# Patient Record
Sex: Female | Born: 1935 | Race: White | Hispanic: No | Smoking: Former smoker
Health system: Southern US, Community
[De-identification: ages and names within clinical notes are randomized; demographics above are authoritative.]

## PROBLEM LIST (undated history)

## (undated) DIAGNOSIS — E785 Hyperlipidemia, unspecified: Secondary | ICD-10-CM

## (undated) DIAGNOSIS — R29898 Other symptoms and signs involving the musculoskeletal system: Secondary | ICD-10-CM

## (undated) DIAGNOSIS — M48061 Spinal stenosis, lumbar region without neurogenic claudication: Secondary | ICD-10-CM

## (undated) DIAGNOSIS — G629 Polyneuropathy, unspecified: Secondary | ICD-10-CM

## (undated) DIAGNOSIS — G1221 Amyotrophic lateral sclerosis: Secondary | ICD-10-CM

## (undated) DIAGNOSIS — I10 Essential (primary) hypertension: Secondary | ICD-10-CM

## (undated) DIAGNOSIS — I350 Nonrheumatic aortic (valve) stenosis: Secondary | ICD-10-CM

## (undated) DIAGNOSIS — I2510 Atherosclerotic heart disease of native coronary artery without angina pectoris: Secondary | ICD-10-CM

## (undated) HISTORY — PX: HAND SURGERY: SHX662

## (undated) HISTORY — DX: Essential (primary) hypertension: I10

## (undated) HISTORY — DX: Spinal stenosis, lumbar region without neurogenic claudication: M48.061

## (undated) HISTORY — DX: Polyneuropathy, unspecified: G62.9

## (undated) HISTORY — PX: CHOLECYSTECTOMY: SHX55

## (undated) HISTORY — DX: Hyperlipidemia, unspecified: E78.5

## (undated) HISTORY — DX: Atherosclerotic heart disease of native coronary artery without angina pectoris: I25.10

## (undated) HISTORY — DX: Other symptoms and signs involving the musculoskeletal system: R29.898

## (undated) HISTORY — PX: CARDIAC SURGERY: SHX584

## (undated) HISTORY — DX: Nonrheumatic aortic (valve) stenosis: I35.0

## (undated) HISTORY — PX: CATARACT EXTRACTION: SUR2

---

## 2011-10-08 DIAGNOSIS — M790 Rheumatism, unspecified: Secondary | ICD-10-CM | POA: Diagnosis not present

## 2011-10-25 DIAGNOSIS — I359 Nonrheumatic aortic valve disorder, unspecified: Secondary | ICD-10-CM | POA: Diagnosis not present

## 2011-10-26 DIAGNOSIS — E785 Hyperlipidemia, unspecified: Secondary | ICD-10-CM | POA: Diagnosis not present

## 2011-11-10 DIAGNOSIS — I10 Essential (primary) hypertension: Secondary | ICD-10-CM | POA: Diagnosis not present

## 2011-11-10 DIAGNOSIS — E785 Hyperlipidemia, unspecified: Secondary | ICD-10-CM | POA: Diagnosis not present

## 2011-11-10 DIAGNOSIS — I209 Angina pectoris, unspecified: Secondary | ICD-10-CM | POA: Diagnosis not present

## 2011-11-10 DIAGNOSIS — I2510 Atherosclerotic heart disease of native coronary artery without angina pectoris: Secondary | ICD-10-CM | POA: Diagnosis not present

## 2011-12-03 DIAGNOSIS — R209 Unspecified disturbances of skin sensation: Secondary | ICD-10-CM | POA: Diagnosis not present

## 2011-12-03 DIAGNOSIS — IMO0002 Reserved for concepts with insufficient information to code with codable children: Secondary | ICD-10-CM | POA: Diagnosis not present

## 2011-12-03 DIAGNOSIS — M79609 Pain in unspecified limb: Secondary | ICD-10-CM | POA: Diagnosis not present

## 2011-12-03 DIAGNOSIS — G5600 Carpal tunnel syndrome, unspecified upper limb: Secondary | ICD-10-CM | POA: Diagnosis not present

## 2011-12-14 DIAGNOSIS — M5412 Radiculopathy, cervical region: Secondary | ICD-10-CM | POA: Diagnosis not present

## 2011-12-14 DIAGNOSIS — M5020 Other cervical disc displacement, unspecified cervical region: Secondary | ICD-10-CM | POA: Diagnosis not present

## 2012-05-09 DIAGNOSIS — E785 Hyperlipidemia, unspecified: Secondary | ICD-10-CM | POA: Diagnosis not present

## 2012-05-10 DIAGNOSIS — I209 Angina pectoris, unspecified: Secondary | ICD-10-CM | POA: Diagnosis not present

## 2012-05-10 DIAGNOSIS — I359 Nonrheumatic aortic valve disorder, unspecified: Secondary | ICD-10-CM | POA: Diagnosis not present

## 2012-05-10 DIAGNOSIS — I2510 Atherosclerotic heart disease of native coronary artery without angina pectoris: Secondary | ICD-10-CM | POA: Diagnosis not present

## 2012-05-10 DIAGNOSIS — I10 Essential (primary) hypertension: Secondary | ICD-10-CM | POA: Diagnosis not present

## 2012-06-01 DIAGNOSIS — K2990 Gastroduodenitis, unspecified, without bleeding: Secondary | ICD-10-CM | POA: Diagnosis not present

## 2012-06-01 DIAGNOSIS — K2970 Gastritis, unspecified, without bleeding: Secondary | ICD-10-CM | POA: Diagnosis not present

## 2012-06-01 DIAGNOSIS — Z23 Encounter for immunization: Secondary | ICD-10-CM | POA: Diagnosis not present

## 2012-11-06 DIAGNOSIS — Z79899 Other long term (current) drug therapy: Secondary | ICD-10-CM | POA: Diagnosis not present

## 2012-11-24 DIAGNOSIS — I359 Nonrheumatic aortic valve disorder, unspecified: Secondary | ICD-10-CM | POA: Diagnosis not present

## 2012-11-24 DIAGNOSIS — I10 Essential (primary) hypertension: Secondary | ICD-10-CM | POA: Diagnosis not present

## 2012-11-24 DIAGNOSIS — I2510 Atherosclerotic heart disease of native coronary artery without angina pectoris: Secondary | ICD-10-CM | POA: Diagnosis not present

## 2012-11-24 DIAGNOSIS — I209 Angina pectoris, unspecified: Secondary | ICD-10-CM | POA: Diagnosis not present

## 2013-02-14 DIAGNOSIS — M4802 Spinal stenosis, cervical region: Secondary | ICD-10-CM | POA: Diagnosis not present

## 2013-02-25 DIAGNOSIS — M545 Low back pain: Secondary | ICD-10-CM | POA: Diagnosis not present

## 2013-02-25 DIAGNOSIS — M47814 Spondylosis without myelopathy or radiculopathy, thoracic region: Secondary | ICD-10-CM | POA: Diagnosis not present

## 2013-02-25 DIAGNOSIS — R262 Difficulty in walking, not elsewhere classified: Secondary | ICD-10-CM | POA: Diagnosis present

## 2013-02-25 DIAGNOSIS — M47812 Spondylosis without myelopathy or radiculopathy, cervical region: Secondary | ICD-10-CM | POA: Diagnosis not present

## 2013-02-25 DIAGNOSIS — M549 Dorsalgia, unspecified: Secondary | ICD-10-CM | POA: Diagnosis not present

## 2013-02-25 DIAGNOSIS — D649 Anemia, unspecified: Secondary | ICD-10-CM | POA: Diagnosis not present

## 2013-02-25 DIAGNOSIS — M47817 Spondylosis without myelopathy or radiculopathy, lumbosacral region: Secondary | ICD-10-CM | POA: Diagnosis not present

## 2013-02-25 DIAGNOSIS — I10 Essential (primary) hypertension: Secondary | ICD-10-CM | POA: Diagnosis not present

## 2013-02-25 DIAGNOSIS — Z7982 Long term (current) use of aspirin: Secondary | ICD-10-CM | POA: Diagnosis not present

## 2013-02-25 DIAGNOSIS — Z602 Problems related to living alone: Secondary | ICD-10-CM | POA: Diagnosis not present

## 2013-02-25 DIAGNOSIS — E785 Hyperlipidemia, unspecified: Secondary | ICD-10-CM | POA: Diagnosis not present

## 2013-02-25 DIAGNOSIS — M5126 Other intervertebral disc displacement, lumbar region: Secondary | ICD-10-CM | POA: Diagnosis not present

## 2013-03-08 DIAGNOSIS — M479 Spondylosis, unspecified: Secondary | ICD-10-CM | POA: Diagnosis not present

## 2013-05-07 DIAGNOSIS — G5600 Carpal tunnel syndrome, unspecified upper limb: Secondary | ICD-10-CM | POA: Diagnosis not present

## 2013-05-14 DIAGNOSIS — I10 Essential (primary) hypertension: Secondary | ICD-10-CM | POA: Diagnosis not present

## 2013-05-14 DIAGNOSIS — E789 Disorder of lipoprotein metabolism, unspecified: Secondary | ICD-10-CM | POA: Diagnosis not present

## 2013-05-14 DIAGNOSIS — G5600 Carpal tunnel syndrome, unspecified upper limb: Secondary | ICD-10-CM | POA: Diagnosis not present

## 2013-05-14 DIAGNOSIS — I2510 Atherosclerotic heart disease of native coronary artery without angina pectoris: Secondary | ICD-10-CM | POA: Diagnosis not present

## 2013-05-17 DIAGNOSIS — I359 Nonrheumatic aortic valve disorder, unspecified: Secondary | ICD-10-CM | POA: Diagnosis not present

## 2013-05-17 DIAGNOSIS — R011 Cardiac murmur, unspecified: Secondary | ICD-10-CM | POA: Diagnosis not present

## 2013-05-21 DIAGNOSIS — Z951 Presence of aortocoronary bypass graft: Secondary | ICD-10-CM | POA: Diagnosis not present

## 2013-05-21 DIAGNOSIS — I10 Essential (primary) hypertension: Secondary | ICD-10-CM | POA: Diagnosis not present

## 2013-05-21 DIAGNOSIS — K219 Gastro-esophageal reflux disease without esophagitis: Secondary | ICD-10-CM | POA: Diagnosis not present

## 2013-05-21 DIAGNOSIS — G5600 Carpal tunnel syndrome, unspecified upper limb: Secondary | ICD-10-CM | POA: Diagnosis not present

## 2013-05-21 DIAGNOSIS — R29898 Other symptoms and signs involving the musculoskeletal system: Secondary | ICD-10-CM | POA: Diagnosis not present

## 2013-05-21 DIAGNOSIS — R209 Unspecified disturbances of skin sensation: Secondary | ICD-10-CM | POA: Diagnosis not present

## 2013-05-21 DIAGNOSIS — Z01818 Encounter for other preprocedural examination: Secondary | ICD-10-CM | POA: Diagnosis not present

## 2013-05-24 DIAGNOSIS — G5600 Carpal tunnel syndrome, unspecified upper limb: Secondary | ICD-10-CM | POA: Diagnosis not present

## 2013-05-31 DIAGNOSIS — I2510 Atherosclerotic heart disease of native coronary artery without angina pectoris: Secondary | ICD-10-CM | POA: Diagnosis not present

## 2013-05-31 DIAGNOSIS — E785 Hyperlipidemia, unspecified: Secondary | ICD-10-CM | POA: Diagnosis not present

## 2013-05-31 DIAGNOSIS — I209 Angina pectoris, unspecified: Secondary | ICD-10-CM | POA: Diagnosis not present

## 2013-05-31 DIAGNOSIS — I359 Nonrheumatic aortic valve disorder, unspecified: Secondary | ICD-10-CM | POA: Diagnosis not present

## 2013-06-22 DIAGNOSIS — Z4889 Encounter for other specified surgical aftercare: Secondary | ICD-10-CM | POA: Diagnosis not present

## 2013-06-29 DIAGNOSIS — Z4889 Encounter for other specified surgical aftercare: Secondary | ICD-10-CM | POA: Diagnosis not present

## 2013-07-03 DIAGNOSIS — Z4889 Encounter for other specified surgical aftercare: Secondary | ICD-10-CM | POA: Diagnosis not present

## 2013-07-06 DIAGNOSIS — Z4889 Encounter for other specified surgical aftercare: Secondary | ICD-10-CM | POA: Diagnosis not present

## 2013-07-12 DIAGNOSIS — Z4889 Encounter for other specified surgical aftercare: Secondary | ICD-10-CM | POA: Diagnosis not present

## 2013-07-17 DIAGNOSIS — Z4889 Encounter for other specified surgical aftercare: Secondary | ICD-10-CM | POA: Diagnosis not present

## 2013-08-02 DIAGNOSIS — Z4889 Encounter for other specified surgical aftercare: Secondary | ICD-10-CM | POA: Diagnosis not present

## 2013-08-10 DIAGNOSIS — Z4889 Encounter for other specified surgical aftercare: Secondary | ICD-10-CM | POA: Diagnosis not present

## 2013-08-14 DIAGNOSIS — Z4889 Encounter for other specified surgical aftercare: Secondary | ICD-10-CM | POA: Diagnosis not present

## 2013-11-16 DIAGNOSIS — S0280XA Fracture of other specified skull and facial bones, unspecified side, initial encounter for closed fracture: Secondary | ICD-10-CM | POA: Diagnosis not present

## 2013-11-16 DIAGNOSIS — M62838 Other muscle spasm: Secondary | ICD-10-CM | POA: Diagnosis not present

## 2013-12-11 DIAGNOSIS — I359 Nonrheumatic aortic valve disorder, unspecified: Secondary | ICD-10-CM | POA: Diagnosis not present

## 2013-12-25 DIAGNOSIS — I209 Angina pectoris, unspecified: Secondary | ICD-10-CM | POA: Diagnosis not present

## 2013-12-25 DIAGNOSIS — E785 Hyperlipidemia, unspecified: Secondary | ICD-10-CM | POA: Diagnosis not present

## 2013-12-25 DIAGNOSIS — I10 Essential (primary) hypertension: Secondary | ICD-10-CM | POA: Diagnosis not present

## 2013-12-25 DIAGNOSIS — I359 Nonrheumatic aortic valve disorder, unspecified: Secondary | ICD-10-CM | POA: Diagnosis not present

## 2013-12-27 DIAGNOSIS — M542 Cervicalgia: Secondary | ICD-10-CM | POA: Diagnosis not present

## 2014-01-01 DIAGNOSIS — M542 Cervicalgia: Secondary | ICD-10-CM | POA: Diagnosis not present

## 2014-01-03 DIAGNOSIS — M542 Cervicalgia: Secondary | ICD-10-CM | POA: Diagnosis not present

## 2014-01-07 DIAGNOSIS — M542 Cervicalgia: Secondary | ICD-10-CM | POA: Diagnosis not present

## 2014-01-10 DIAGNOSIS — M542 Cervicalgia: Secondary | ICD-10-CM | POA: Diagnosis not present

## 2014-01-15 DIAGNOSIS — M542 Cervicalgia: Secondary | ICD-10-CM | POA: Diagnosis not present

## 2014-01-17 DIAGNOSIS — M542 Cervicalgia: Secondary | ICD-10-CM | POA: Diagnosis not present

## 2014-02-27 DIAGNOSIS — E785 Hyperlipidemia, unspecified: Secondary | ICD-10-CM | POA: Diagnosis not present

## 2014-09-30 DIAGNOSIS — I2510 Atherosclerotic heart disease of native coronary artery without angina pectoris: Secondary | ICD-10-CM | POA: Diagnosis not present

## 2014-09-30 DIAGNOSIS — Z79899 Other long term (current) drug therapy: Secondary | ICD-10-CM | POA: Diagnosis not present

## 2014-09-30 DIAGNOSIS — E785 Hyperlipidemia, unspecified: Secondary | ICD-10-CM | POA: Diagnosis not present

## 2014-09-30 DIAGNOSIS — I10 Essential (primary) hypertension: Secondary | ICD-10-CM | POA: Diagnosis not present

## 2014-09-30 DIAGNOSIS — K219 Gastro-esophageal reflux disease without esophagitis: Secondary | ICD-10-CM | POA: Diagnosis not present

## 2014-09-30 DIAGNOSIS — G5600 Carpal tunnel syndrome, unspecified upper limb: Secondary | ICD-10-CM | POA: Diagnosis not present

## 2014-09-30 DIAGNOSIS — M5137 Other intervertebral disc degeneration, lumbosacral region: Secondary | ICD-10-CM | POA: Diagnosis not present

## 2014-09-30 DIAGNOSIS — G629 Polyneuropathy, unspecified: Secondary | ICD-10-CM | POA: Diagnosis not present

## 2014-09-30 DIAGNOSIS — Z23 Encounter for immunization: Secondary | ICD-10-CM | POA: Diagnosis not present

## 2014-09-30 DIAGNOSIS — Z7901 Long term (current) use of anticoagulants: Secondary | ICD-10-CM | POA: Diagnosis not present

## 2014-10-31 DIAGNOSIS — M6281 Muscle weakness (generalized): Secondary | ICD-10-CM | POA: Diagnosis not present

## 2014-11-04 DIAGNOSIS — R0989 Other specified symptoms and signs involving the circulatory and respiratory systems: Secondary | ICD-10-CM | POA: Diagnosis not present

## 2014-11-04 DIAGNOSIS — E782 Mixed hyperlipidemia: Secondary | ICD-10-CM | POA: Diagnosis not present

## 2014-11-04 DIAGNOSIS — I10 Essential (primary) hypertension: Secondary | ICD-10-CM | POA: Diagnosis not present

## 2014-11-04 DIAGNOSIS — I2510 Atherosclerotic heart disease of native coronary artery without angina pectoris: Secondary | ICD-10-CM | POA: Diagnosis not present

## 2014-11-11 ENCOUNTER — Encounter: Payer: Self-pay | Admitting: *Deleted

## 2014-11-12 ENCOUNTER — Encounter: Payer: Self-pay | Admitting: Neurology

## 2014-11-12 ENCOUNTER — Ambulatory Visit (INDEPENDENT_AMBULATORY_CARE_PROVIDER_SITE_OTHER): Payer: Medicare Other | Admitting: Neurology

## 2014-11-12 VITALS — BP 122/68 | HR 64 | Ht 63.0 in | Wt 160.0 lb

## 2014-11-12 DIAGNOSIS — M6281 Muscle weakness (generalized): Secondary | ICD-10-CM | POA: Diagnosis not present

## 2014-11-12 DIAGNOSIS — M6259 Muscle wasting and atrophy, not elsewhere classified, multiple sites: Secondary | ICD-10-CM | POA: Diagnosis not present

## 2014-11-12 DIAGNOSIS — M6258 Muscle wasting and atrophy, not elsewhere classified, other site: Secondary | ICD-10-CM

## 2014-11-12 NOTE — Progress Notes (Signed)
PATIENT: Barbara Collins DOB: [birth year]  HISTORICAL  Barbara Collins is a 81 yo RH female with her daughter Barbara Collins at visit, referred by primary care physician Dr. Jillyn Hidden.  She had a history of hypertension, hyperlipidemia, coronary artery disease, open heart surgery  In 2013, she had accident of neck being hit by her trunk when she bending down, she had instant neck pain, a month later, she noticed right hand muscle weakness, no sensory loss, no significant neck pain at that time, no left arm involvement, no gait difficulty, she was diagnosed with right hand carpal tunnel, had carpal tunnel release surgery around 2014, this was done at [place], without improving her symptoms,  She had a gradually worsening right hand, arm muscle atrophy, and weakness, significant worsening even since she moved in with her daughter in September 2015.  She has mild bilateral toes paresthesia, no gait difficulty, no bowel and bladder incontinence, no left hand symptoms.  REVIEW OF SYSTEMS: Full 14 system review of systems performed and notable only for easy bruising  ALLERGIES: Not on File  HOME MEDICATIONS: Current Outpatient Prescriptions  Medication Sig Dispense Refill  . Ascorbic Acid (VITAMIN C PO) Take by mouth.    Marland Kitchen aspirin EC 81 MG tablet Take 81 mg by mouth daily.    Marland Kitchen atorvastatin (LIPITOR) 20 MG tablet Take 20 mg by mouth daily.    Marland Kitchen ezetimibe (ZETIA) 10 MG tablet Take 10 mg by mouth every other day.    . famotidine (PEPCID AC) 10 MG chewable tablet Chew 10 mg by mouth as needed for heartburn.    . Fish Oil-Cholecalciferol (FISH OIL + D3 PO) Take by mouth.    . gabapentin (NEURONTIN) 100 MG capsule Take 100 mg by mouth. Two capsules twice daily.    . metoprolol (LOPRESSOR) 50 MG tablet Take 50 mg by mouth. 0.5 tablet twice daily    . potassium chloride (K-DUR,KLOR-CON) 10 MEQ tablet Take 10 mEq by mouth 2 (two) times daily.    . valsartan-hydrochlorothiazide (DIOVAN-HCT) 80-12.5 MG per tablet Take  1 tablet by mouth daily.     No current facility-administered medications for this visit.    PAST MEDICAL HISTORY: Past Medical History  Diagnosis Date  . Hypertension   . Hyperlipemia   . Neuropathy     Feet  . Right arm weakness   . Aortic stenosis   . Lumbar spinal stenosis   . CAD (coronary artery disease)     PAST SURGICAL HISTORY: Past Surgical History  Procedure Laterality Date  . Cardiac surgery      2005  . Cesarean section    . Cholecystectomy    . Cataract extraction      Bilateral  . Hand surgery      Right    FAMILY HISTORY: Family History  Problem Relation Age of Onset  . Hypertension Mother     SOCIAL HISTORY:  History   Social History  . Marital Status: Widowed    Spouse Name: N/A  . Number of Children: 1  . Years of Education: College   Occupational History  . Not on file.   Social History Main Topics  . Smoking status: Former Games developer  . Smokeless tobacco: Not on file  . Alcohol Use: No     Comment: Rare  . Drug Use: No  . Sexual Activity: Not on file   Other Topics Concern  . Not on file   Social History Narrative   Lives at home with her  daughter.   Right-handed.   2 cups caffeine/day       PHYSICAL EXAM   Filed Vitals:   11/12/14 1050  BP: 122/68  Pulse: 64  Height: 5\' 3"  (1.6 m)  Weight: 160 lb (72.576 kg)    Not recorded      Body mass index is 28.35 kg/(m^2).  PHYSICAL EXAMNIATION:  Gen: NAD, conversant, well nourised, obese, well groomed                     Cardiovascular: Regular rate rhythm, no peripheral edema, warm, nontender. Eyes: Conjunctivae clear without exudates or hemorrhage Neck: Supple, no carotid bruise. Pulmonary: Clear to auscultation bilaterally   NEUROLOGICAL EXAM:  MENTAL STATUS: Speech:    Speech is normal; fluent and spontaneous with normal comprehension.  Cognition:    The patient is oriented to person, place, and time;     recent and remote memory intact;     language  fluent;     normal attention, concentration,     fund of knowledge.  CRANIAL NERVES: CN II: Visual fields are full to confrontation. Fundoscopic exam is normal with sharp discs and no vascular changes. Venous pulsations are present bilaterally. Pupils are 4 mm and briskly reactive to light. Visual acuity is 20/20 bilaterally. CN III, IV, VI: extraocular movement are normal. No ptosis. CN V: Facial sensation is intact to pinprick in all 3 divisions bilaterally. Corneal responses are intact.  CN VII: Face is symmetric with normal eye closure and smile. Mild cheek puff weakness CN VIII: Hearing is normal to rubbing fingers CN IX, X: Palate elevates symmetrically. Phonation is normal. CN XI: Head turning and shoulder shrug are intact CN XII: Tongue is midline with normal movements and no atrophy. Mild neck flexion weakness.  MOTOR: Right hand significant atrophy   Shoulder abduction Shoulder external rotation Elbow flexion Elbow extension Wrist flexion Wrist extension Finger abduction Hip flexion Knee flexion Knee extension Ankle dorsi flexion Ankle plantar flexion  R 4 4 4- 4 3 3 3 5 5 5 5 5   L 5 5 5 5 5 5 5 5 5 5 5 5     REFLEXES: Reflexes are 2+ and symmetric at the biceps, triceps, knees, and ankles. Plantar responses are flexor.  SENSORY: Light touch, pinprick, position sense, and vibration sense are intact in fingers and toes.  COORDINATION: Rapid alternating movements and fine finger movements are intact. There is no dysmetria on finger-to-nose and heel-knee-shin. There are no abnormal or extraneous movements.   GAIT/STANCE: Posture is normal. Gait is steady with normal steps, base, arm swing, and turning. Heel and toe walking are normal. Tandem gait is normal.  Romberg is absent.   DIAGNOSTIC DATA (LABS, IMAGING, TESTING) - I reviewed patient records, labs, notes, testing and imaging myself where available.  ASSESSMENT AND PLAN  Barbara Collins is a 81 y.o. female  presenting  with painless right hand, arm weakness since 2013, this following her neck injury, on examination, she has significant atrophy of right hand muscles, mild right arm muscle atrophy, hyperreflexia, mild neck flexion weakness, facial muscle weakness.   1. Differentiation diagnosis includes cervical pathology, central nervous system degenerative disorder 2, EMG nerve conduction study 3. MRI of cervical spine   Levert FeinsteinYijun Aleynah Rocchio, M.D. Ph.D.  [facility] [address], [address] [place], [place] [address] Ph: [phone]) [phone] Fax: [phone]

## 2014-11-20 ENCOUNTER — Ambulatory Visit (INDEPENDENT_AMBULATORY_CARE_PROVIDER_SITE_OTHER): Payer: Medicare Other | Admitting: Neurology

## 2014-11-20 DIAGNOSIS — M6258 Muscle wasting and atrophy, not elsewhere classified, other site: Secondary | ICD-10-CM | POA: Insufficient documentation

## 2014-11-20 DIAGNOSIS — M6259 Muscle wasting and atrophy, not elsewhere classified, multiple sites: Secondary | ICD-10-CM | POA: Diagnosis not present

## 2014-11-20 DIAGNOSIS — M6281 Muscle weakness (generalized): Secondary | ICD-10-CM

## 2014-11-20 DIAGNOSIS — R531 Weakness: Secondary | ICD-10-CM | POA: Diagnosis not present

## 2014-11-20 NOTE — Procedures (Signed)
   NCS (NERVE CONDUCTION STUDY) WITH EMG (ELECTROMYOGRAPHY) REPORT   STUDY DATE: November 20 2014 PATIENT NAME: Barbara Collins DOB: 07-01-1933 MRN: [id number]    TECHNOLOGIST: Gearldine ShownLorraine Jones ELECTROMYOGRAPHER: Levert FeinsteinYan, Shuaib Corsino M.D.  CLINICAL INFORMATION:  81 years old female, presenting with painless muscle atrophy, weakness  On examination: She has mild eye-closure, cheek puff weakness, mild neck flexion weakness, significant right arm, hand muscle atrophy, moderate right shoulder abduction, flexion, extension weakness, significant right hand muscle weakness, mild left upper extremity proximal, bilateral lower extremity proximal muscle weakness. hyperreflexia.  FINDINGS: NERVE CONDUCTION STUDY: Bilateral median, ulnar sensory responses were normal. Left ulnar, median motor responses were normal, there was no evidence of conduction block. Right median, ulnar motor responses showed significantly decreased to C map amplitude, with normal conduction velocity.  NEEDLE ELECTROMYOGRAPHY: Selected needle examination was performed at bilateral upper extremity muscles, right cervical paraspinal muscles, bilateral lower extremity muscles, right lumbosacral paraspinal muscles, right thoracic paraspinal muscles, right sternocleidomastoid, right genioglossus.  Bilateral biceps, deltoid, pronator teres, extensor digitorum communis: Increased insertional activity, 1-2 plus spontaneous activity, enlarged complex motor unit potential, with decreased recruitment patterns.  Right first dorsal interossei, increased insertional activity, 2 plus spontaneous activity, enlarged complex motor unit potential, decreased recruitment patterns  Bilateral vastus lateralis, tibialis anterior, tibialis posterior, right medial gastrocnemius, increased insertional activity, 1 plus spontaneous activity, enlarged complex motor unit potential, with decreased recruitment patterns.  Patient has poor relaxation, I was not able to  appreciate any spontaneous activity at right cervical paraspinal muscles, right C5, C6, C7. There was no spontaneous activity noticed that right L4, L5, S1.  Right thoracic paraspinals, right T9, T10, increased insertional activity, normal morphology motor unit potential, poor relaxation, there was no spontaneous activity noticed.  Right sternocleidomastoid, increased insertional activity, no spontaneous activity, mixture of normal, enlarged complex motor unit potential, with decreased recruitment patterns.  Right Genioglossus: Normal insertion activity, no spontaneous activity, normal morphology motor unit potential, with normal recruitment patterns.  IMPRESSION:  This is an marked abnormal study, there was evidence of widespread active chronic neuropathic changes involving bilateral cervical, lumbar sacral myotomes, possible right sternocleidomastoid, differentiation diagnosis of above findings includes motor neuron disease, bilateral cervical, bilateral lumbosacral radiculopathies, also includes possibility of paraneoplastic syndrome, nutritional deficiency, inflammatory etiology. Extensive evaluation is planned.   INTERPRETING PHYSICIAN:   Levert FeinsteinYan, Boston Cookson M.D. Ph.D. [facility] [address] [place], [place] [address] [phone]

## 2014-11-20 NOTE — Progress Notes (Signed)
PATIENT: Barbara Collins DOB: Mar 09, 1933  HISTORICAL  Barbara PateeHazel Collins is a 81 yo RH female with her daughter Barbara Collins at visit, referred by primary care physician Dr. Jillyn HiddenFulp.  She had a history of hypertension, hyperlipidemia, coronary artery disease, open heart surgery  In 2013, she had accident of neck being hit by her trunk when she bending down, she had instant neck pain, a month later, she noticed right hand muscle weakness, no sensory loss, no significant neck pain at that time, no left arm involvement, no gait difficulty, she was diagnosed with right hand carpal tunnel, had carpal tunnel release surgery around 2014, this was done at [place], without improving her symptoms,  She had a gradually worsening right hand, arm muscle atrophy, and weakness, significant worsening even since she moved in with her daughter in September 2015.  She has mild bilateral toes paresthesia, no gait difficulty, no bowel and bladder incontinence, no left hand symptoms.  UPDATE November 20 2014: She returned for electrodiagnostic study today, there was well-preserved bilateral upper extremity sensory potentials, but decreased right median, ulnar C map amplitude, normal conduction velocity, no evidence of conduction block, normal left median, and ulnar motor responses. Extensive active chronic neuropathic changes involving bilateral cervical, lumbar sacral myotomes, mild chronic neuropathic changes involving right sternocleidomastoid  Above findings raise the possibility of motor neuron disease, she denies sensory loss, no bowel bladder incontinence, extensive evaluation is initiated    REVIEW OF SYSTEMS: Full 14 system review of systems performed and notable only for easy bruising  ALLERGIES: Not on File  HOME MEDICATIONS: Current Outpatient Prescriptions  Medication Sig Dispense Refill  . Ascorbic Acid (VITAMIN C PO) Take by mouth.    Marland Kitchen. aspirin EC 81 MG tablet Take 81 mg by mouth daily.    Marland Kitchen. atorvastatin  (LIPITOR) 20 MG tablet Take 20 mg by mouth daily.    Marland Kitchen. ezetimibe (ZETIA) 10 MG tablet Take 10 mg by mouth every other day.    . famotidine (PEPCID AC) 10 MG chewable tablet Chew 10 mg by mouth as needed for heartburn.    . Fish Oil-Cholecalciferol (FISH OIL + D3 PO) Take by mouth.    . gabapentin (NEURONTIN) 100 MG capsule Take 100 mg by mouth. Two capsules twice daily.    . metoprolol (LOPRESSOR) 50 MG tablet Take 50 mg by mouth. 0.5 tablet twice daily    . potassium chloride (K-DUR,KLOR-CON) 10 MEQ tablet Take 10 mEq by mouth 2 (two) times daily.    . valsartan-hydrochlorothiazide (DIOVAN-HCT) 80-12.5 MG per tablet Take 1 tablet by mouth daily.     No current facility-administered medications for this visit.    PAST MEDICAL HISTORY: Past Medical History  Diagnosis Date  . Hypertension   . Hyperlipemia   . Neuropathy     Feet  . Right arm weakness   . Aortic stenosis   . Lumbar spinal stenosis   . CAD (coronary artery disease)     PAST SURGICAL HISTORY: Past Surgical History  Procedure Laterality Date  . Cardiac surgery      2005  . Cesarean section    . Cholecystectomy    . Cataract extraction      Bilateral  . Hand surgery      Right    FAMILY HISTORY: Family History  Problem Relation Age of Onset  . Hypertension Mother     SOCIAL HISTORY:  History   Social History  . Marital Status: Widowed    Spouse Name: N/A  .  Number of Children: 1  . Years of Education: College   Occupational History  . Not on file.   Social History Main Topics  . Smoking status: Former Games developer  . Smokeless tobacco: Not on file  . Alcohol Use: No     Comment: Rare  . Drug Use: No  . Sexual Activity: Not on file   Other Topics Concern  . Not on file   Social History Narrative   Lives at home with her daughter.   Right-handed.   2 cups caffeine/day       PHYSICAL EXAM   There were no vitals filed for this visit.  Not recorded      There is no weight on file to  calculate BMI.  PHYSICAL EXAMNIATION:  Gen: NAD, conversant, well nourised, obese, well groomed                     Cardiovascular: Regular rate rhythm, no peripheral edema, warm, nontender. Eyes: Conjunctivae clear without exudates or hemorrhage Neck: Supple, no carotid bruise. Pulmonary: Clear to auscultation bilaterally   NEUROLOGICAL EXAM:  MENTAL STATUS: Speech:    Speech is normal; fluent and spontaneous with normal comprehension.  Cognition:    The patient is oriented to person, place, and time;     recent and remote memory intact;     language fluent;     normal attention, concentration,     fund of knowledge.  CRANIAL NERVES: CN II: Visual fields are full to confrontation. Fundoscopic exam is normal with sharp discs and no vascular changes. Venous pulsations are present bilaterally. Pupils are 4 mm and briskly reactive to light. Visual acuity is 20/20 bilaterally. CN III, IV, VI: extraocular movement are normal. No ptosis. CN V: Facial sensation is intact to pinprick in all 3 divisions bilaterally. Corneal responses are intact.  CN VII: Face is symmetric with normal eye closure and smile. Mild cheek puff weakness CN VIII: Hearing is normal to rubbing fingers CN IX, X: Palate elevates symmetrically. Phonation is normal. CN XI: Head turning and shoulder shrug are intact CN XII: Tongue is midline with normal movements and no atrophy. Mild neck flexion weakness.  MOTOR: Right hand significant atrophy   Shoulder abduction Shoulder external rotation Elbow flexion Elbow extension Wrist flexion Wrist extension Finger abduction Hip flexion Knee flexion Knee extension Ankle dorsi flexion Ankle plantar flexion  R 4 4 4- 5- L 5- 5 5- 5    REFLEXES: Reflexes are 2+ and symmetric at the biceps, triceps, knees, and ankles. Plantar responses are flexor.  SENSORY: Light touch, pinprick, position sense, and vibration sense are intact in fingers and  toes.  COORDINATION: Rapid alternating movements and fine finger movements are intact. There is no dysmetria on finger-to-nose and heel-knee-shin. There are no abnormal or extraneous movements.   GAIT/STANCE: Able to get up arm crossed. Gait is mildly unsteady, dragging right leg some,   Romberg is absent.   DIAGNOSTIC DATA (LABS, IMAGING, TESTING) - I reviewed patient records, labs, notes, testing and imaging myself where available.  ASSESSMENT AND PLAN  Monseratt Ledin is a 81 y.o. female  presenting with painless right hand, arm weakness since 2013,on examination, she has significant atrophy of right hand muscles, mild right arm muscle atrophy, hyperreflexia, mild neck flexion weakness, facial muscle weakness, also mild left upper extremity, bilateral lower extremity weakness. Today's electrodiagnostic study has demonstrated  widespread active neuropathic changes involving bilateral cervical, lumbar sacral myotomes. Mild chronic neuropathic changes involving right sternocleidomastoid,  1, differentiation diagnosis including motor neuron disease, also including bilateral cervical, lumbar sacral radiculopathy, paraneoplastic syndrome, nutritional deficiency, inflammatory etiology 2, extensive laboratory evaluations 3, MRI of the brain, cervical, lumbar, thoracic spines,  4, return to clinic in 2-3 weeks      Levert Feinstein, M.D. Ph.D.  [facility] [address], [address] [place], [place] [address] Ph: [phone] Fax: [phone]

## 2014-11-26 ENCOUNTER — Ambulatory Visit
Admission: RE | Admit: 2014-11-26 | Discharge: 2014-11-26 | Disposition: A | Discharge status: Home / Self Care | Payer: Medicare Other | Source: Ambulatory Visit | Attending: Neurology | Admitting: Neurology

## 2014-11-26 DIAGNOSIS — M6258 Muscle wasting and atrophy, not elsewhere classified, other site: Secondary | ICD-10-CM

## 2014-11-26 DIAGNOSIS — M6259 Muscle wasting and atrophy, not elsewhere classified, multiple sites: Secondary | ICD-10-CM | POA: Diagnosis not present

## 2014-11-26 DIAGNOSIS — M6281 Muscle weakness (generalized): Secondary | ICD-10-CM | POA: Diagnosis not present

## 2014-12-03 ENCOUNTER — Telehealth: Payer: Self-pay | Admitting: Neurology

## 2014-12-03 NOTE — Telephone Encounter (Signed)
Left message to return my call.  

## 2014-12-03 NOTE — Telephone Encounter (Signed)
Barbara Collins: Please call patient, MRI of the cervical showed degenerative disc disease, but the findings are not severe enough to explain the difficulty that she experienced, I will go over findings at her next follow-up visit, she should continue to finish rest of MRIs as planned,  This is an abnormal MRI of the cervical spine showing multilevel degenerative changes as detailed above. The most significant findings are at C6-C7 where a combination of mild broad disc herniation with uncovertebral spurring leads to mild to moderate right foraminal narrowing. Although there is no definite nerve root compression at that level, there could be dynamic impingement of the right C7 nerve root. There is less potential for nerve root impingement at the other cervical levels.

## 2014-12-04 NOTE — Telephone Encounter (Signed)
Informed Barbara LaityJodi (dgt on [facility]IP[facility]) of MRI results.  Her mother will proceed w/ additional scan and keep her follow up to discuss all results.

## 2014-12-07 ENCOUNTER — Ambulatory Visit
Admission: RE | Admit: 2014-12-07 | Discharge: 2014-12-07 | Disposition: A | Discharge status: Home / Self Care | Payer: Medicare Other | Source: Ambulatory Visit | Attending: Neurology | Admitting: Neurology

## 2014-12-07 DIAGNOSIS — M6258 Muscle wasting and atrophy, not elsewhere classified, other site: Secondary | ICD-10-CM

## 2014-12-07 DIAGNOSIS — M6281 Muscle weakness (generalized): Secondary | ICD-10-CM

## 2014-12-07 DIAGNOSIS — R531 Weakness: Secondary | ICD-10-CM

## 2014-12-07 DIAGNOSIS — M6259 Muscle wasting and atrophy, not elsewhere classified, multiple sites: Secondary | ICD-10-CM | POA: Diagnosis not present

## 2014-12-10 ENCOUNTER — Telehealth: Payer: Self-pay | Admitting: Neurology

## 2014-12-10 NOTE — Telephone Encounter (Signed)
Left another message to call.

## 2014-12-10 NOTE — Telephone Encounter (Signed)
Please call patients, MRI of thoracic, and the brain showed no significant abnormalities  MRI of lumbar showed multilevel degenerative disc disease, with multilevel foraminal stenosis, I will go over detail with her at her next follow-up visit December 26 2014    IMPRESSION: This is an abnormal MRI of the lumbar spine showing moderate to severe multilevel degenerative changes as detailed above. The most significant findings are: 1. Reduced disc height, endplate spurring, disc protrusion, facet hypertrophy and right ligamentum flavum hypertrophy at L3-L4 combining to cause mild spinal stenosis. This leads to moderately severe right lateral stenosis, moderate right foraminal narrowing and moderate left lateral recess stenosis. There is probable right L4 nerve root compression. There could also be dynamic impingement of the right L3 and left L4 nerve roots. 2. Reduced disc height, endplate spurring, disc protrusion, left greater than right facet hypertrophy combined to cause moderate spinal stenosis at L4-L5. This leads to severe left foraminal and lateral recess stenosis and moderate right foraminal and lateral recess stenosis. There is probable compression of the left L4 and L5 nerve roots. There also could be dynamic impingement of the right L4 and L5 nerve roots. 3. Broad disc protrusion, endplate spurring and mild facet hypertrophy with moderate left ligamentum flavum hypertrophy at L5-S1 causing moderately severe left foraminal and lateral recess stenosis and moderate right lateral recess stenosis. There is probable left L5 nerve root compression. There is potential for dynamic impingement of the traversing S1 nerve roots. 4. There are moderate degenerative changes at L1-L2 and L2-L3 that do not lead to nerve root impingement

## 2014-12-10 NOTE — Telephone Encounter (Signed)
Left message for a return call

## 2014-12-11 NOTE — Telephone Encounter (Signed)
Pt and dgt aware of results and will keep f/u to further discuss.

## 2014-12-11 NOTE — Telephone Encounter (Signed)
Left message to return call 

## 2014-12-23 ENCOUNTER — Other Ambulatory Visit (INDEPENDENT_AMBULATORY_CARE_PROVIDER_SITE_OTHER): Payer: Self-pay

## 2014-12-23 ENCOUNTER — Telehealth: Payer: Self-pay | Admitting: Neurology

## 2014-12-23 ENCOUNTER — Other Ambulatory Visit: Payer: Self-pay | Admitting: Neurology

## 2014-12-23 DIAGNOSIS — Z0289 Encounter for other administrative examinations: Secondary | ICD-10-CM

## 2014-12-23 DIAGNOSIS — M6258 Muscle wasting and atrophy, not elsewhere classified, other site: Secondary | ICD-10-CM

## 2014-12-23 DIAGNOSIS — R260 Ataxic gait: Secondary | ICD-10-CM | POA: Diagnosis not present

## 2014-12-23 DIAGNOSIS — M6281 Muscle weakness (generalized): Secondary | ICD-10-CM

## 2014-12-23 DIAGNOSIS — G603 Idiopathic progressive neuropathy: Secondary | ICD-10-CM | POA: Diagnosis not present

## 2014-12-23 DIAGNOSIS — M6259 Muscle wasting and atrophy, not elsewhere classified, multiple sites: Secondary | ICD-10-CM | POA: Diagnosis not present

## 2014-12-23 NOTE — Telephone Encounter (Signed)
Reentered lab order.

## 2014-12-23 NOTE — Addendum Note (Signed)
Addended by: Levert FeinsteinYAN, Shakima Nisley on: 12/23/2014 11:07 AM   Modules accepted: Orders

## 2014-12-24 ENCOUNTER — Telehealth: Payer: Self-pay | Admitting: *Deleted

## 2014-12-24 NOTE — Telephone Encounter (Signed)
Appt has been changed

## 2014-12-24 NOTE — Telephone Encounter (Signed)
Pt and dgt aware of new time.

## 2014-12-24 NOTE — Telephone Encounter (Signed)
Left message for her daughter, Jodi, to returLennox Laityn my call.  I need to change her appt on 12/26/14.  Dr. Terrace ArabiaYan will need 30 minutes with her.  We can reschedule to any 30 minute open time available this week.

## 2014-12-25 ENCOUNTER — Telehealth: Payer: Self-pay | Admitting: Neurology

## 2014-12-25 NOTE — Telephone Encounter (Signed)
Spoke to BoydJodi - she is aware of results and will call PCP to schedule an appt for her mother.

## 2014-12-25 NOTE — Telephone Encounter (Signed)
Please call patient, elevated TSH, indicating hypothyroidism, she may contact her primary care physician for further evaluation and treatment, but it less likely account for her symptoms

## 2014-12-25 NOTE — Telephone Encounter (Signed)
Left message for her dgt, Lennox LaityJodi to return my call.  Patient has a pending appt on 12/17/14.

## 2014-12-26 ENCOUNTER — Ambulatory Visit: Payer: Medicare Other | Admitting: Neurology

## 2014-12-27 ENCOUNTER — Ambulatory Visit (INDEPENDENT_AMBULATORY_CARE_PROVIDER_SITE_OTHER): Payer: Medicare Other | Admitting: Neurology

## 2014-12-27 ENCOUNTER — Encounter: Payer: Self-pay | Admitting: Neurology

## 2014-12-27 VITALS — BP 137/63 | HR 73 | Ht 63.0 in | Wt 157.0 lb

## 2014-12-27 DIAGNOSIS — G1220 Motor neuron disease, unspecified: Secondary | ICD-10-CM | POA: Diagnosis not present

## 2014-12-27 DIAGNOSIS — M6281 Muscle weakness (generalized): Secondary | ICD-10-CM

## 2014-12-27 NOTE — Progress Notes (Signed)
PATIENT: Barbara Collins DOB: 16-Jul-1933  HISTORICAL  Barbara Collins is a 81 yo RH female with her daughter Barbara Collins at visit, referred by primary care physician Dr. Jillyn Hidden.  She had a history of hypertension, hyperlipidemia, coronary artery disease, open heart surgery  In 2013, she had accident of neck being hit by her trunk when she bending down, she had instant neck pain, a month later, she noticed right hand muscle weakness, no sensory loss, no significant neck pain at that time, no left arm involvement, no gait difficulty, she was diagnosed with right hand carpal tunnel, had carpal tunnel release surgery around 2014, this was done at [place], without improving her symptoms,  She had a gradually worsening right hand, arm muscle atrophy, and weakness, significant worsening even since she moved in with her daughter in September 2015.  She has mild bilateral toes paresthesia, no gait difficulty, no bowel and bladder incontinence, no left hand symptoms.  UPDATE November 20 2014: She returned for electrodiagnostic study today, there was well-preserved bilateral upper extremity sensory potentials, but decreased right median, ulnar C map amplitude, normal conduction velocity, no evidence of conduction block, normal left median, and ulnar motor responses.   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 We have reviewed her MRI findings, MRI of the brain showed age related atrophy, MRI of cervical spine, mild degenerative disc disease, no significant central canal  stenosis, MRI of thoracic spine, no significant abnormality MRI of lumbar spine: Severe multilevel degenerative changes, mild canal stenosis at L 3 4, moderate to severe right foraminal stenosis, L4-5, severe left foraminal stenosis,  Laboratory evaluation, mild elevated TSH, normal T3-T4, mild elevated CPK, normal ANA, RPR, B12, CMP, CBC   REVIEW OF SYSTEMS: Full 14 system review of systems performed and notable only for easy bruising  ALLERGIES: Not on File  HOME MEDICATIONS: Current Outpatient Prescriptions  Medication Sig Dispense Refill  . Ascorbic Acid (VITAMIN C PO) Take by mouth.    Marland Kitchen aspirin EC 81 MG tablet Take 81 mg by mouth daily.    Marland Kitchen atorvastatin (LIPITOR) 20 MG tablet Take 20 mg by mouth daily.    Marland Kitchen ezetimibe (ZETIA) 10 MG tablet Take 10 mg by mouth every other day.    . famotidine (PEPCID AC) 10 MG chewable tablet Chew 10 mg by mouth as needed for heartburn.    . Fish Oil-Cholecalciferol (FISH OIL + D3 PO) Take by mouth.    . gabapentin (NEURONTIN) 100 MG capsule Take 100 mg by mouth. Two capsules twice daily.    . metoprolol (LOPRESSOR) 50 MG tablet Take 50 mg by mouth. 0.5 tablet twice daily    . potassium chloride (K-DUR,KLOR-CON) 10 MEQ tablet Take 10 mEq by mouth 2 (two) times daily.    . valsartan-hydrochlorothiazide (DIOVAN-HCT) 80-12.5 MG per tablet Take 1 tablet by mouth daily.     No current facility-administered medications for this visit.    PAST MEDICAL HISTORY: Past Medical History  Diagnosis Date  . Hypertension   . Hyperlipemia   . Neuropathy     Feet  . Right arm weakness   . Aortic stenosis   . Lumbar spinal stenosis   .  CAD (coronary artery disease)     PAST SURGICAL HISTORY: Past Surgical History  Procedure Laterality Date  . Cardiac surgery      2005  . Cesarean section    . Cholecystectomy    . Cataract extraction      Bilateral  . Hand surgery      Right    FAMILY HISTORY: Family History  Problem Relation Age of Onset  .  Hypertension Mother     SOCIAL HISTORY:  History   Social History  . Marital Status: Widowed    Spouse Name: N/A  . Number of Children: 1  . Years of Education: College   Occupational History  . Not on file.   Social History Main Topics  . Smoking status: Former Games developer  . Smokeless tobacco: Not on file  . Alcohol Use: No     Comment: Rare  . Drug Use: No  . Sexual Activity: Not on file   Other Topics Concern  . Not on file   Social History Narrative   Lives at home with her daughter.   Right-handed.   2 cups caffeine/day       PHYSICAL EXAM   Filed Vitals:   12/27/14 0910  BP: 137/63  Pulse: 73  Height:  (1.6 m)  Weight: 157 lb (71.215 kg)    Not recorded      Body mass index is 27.82 kg/(m^2).  PHYSICAL EXAMNIATION:  Gen: NAD, conversant, well nourised, obese, well groomed                     Cardiovascular: Regular rate rhythm, no peripheral edema, warm, nontender. Eyes: Conjunctivae clear without exudates or hemorrhage Neck: Supple, no carotid bruise. Pulmonary: Clear to auscultation bilaterally   NEUROLOGICAL EXAM:  MENTAL STATUS: Speech:    Speech is normal; fluent and spontaneous with normal comprehension.  Cognition:    The patient is oriented to person, place, and time;     recent and remote memory intact;     language fluent;     normal attention, concentration,     fund of knowledge.  CRANIAL NERVES: CN II: Visual fields are full to confrontation. Fundoscopic exam is normal with sharp discs and no vascular changes. Venous pulsations are present bilaterally. Pupils are 4 mm and briskly reactive to light. Visual acuity is 20/20 bilaterally. CN III, IV, VI: extraocular movement are normal. No ptosis. CN V: Facial sensation is intact to pinprick in all 3 divisions bilaterally. Corneal responses are intact.  CN VII: Face is symmetric with normal eye closure and smile. Mild cheek puff weakness CN VIII: Hearing is normal to rubbing  fingers CN IX, X: Palate elevates symmetrically. Phonation is normal. CN XI: Head turning and shoulder shrug are intact CN XII: Tongue is midline with normal movements and no atrophy. Mild neck flexion weakness.  MOTOR: Right hand significant atrophy   Shoulder abduction Shoulder external rotation Elbow flexion Elbow extension Wrist flexion Wrist extension Finger abduction Hip flexion Knee flexion Knee extension Ankle dorsi flexion Ankle plantar flexion  R 4 4 4- 5- L 5- 5 5- 5    REFLEXES: Reflexes are 2+ and symmetric at the biceps, triceps, knees, and ankles. Plantar responses are extensor bilaterally.  SENSORY: Light touch, pinprick, position sense, and vibration sense are intact in fingers and toes.  COORDINATION: Rapid alternating movements and fine finger movements  are intact. There is no dysmetria on finger-to-nose and heel-knee-shin. There are no abnormal or extraneous movements.   GAIT/STANCE: Able to get up arm crossed. Gait is mildly unsteady, dragging right leg some,   Romberg is absent.   DIAGNOSTIC DATA (LABS, IMAGING, TESTING) - I reviewed patient records, labs, notes, testing and imaging myself where available.  ASSESSMENT AND PLAN  Barbara Collins is a 81 y.o. female  presenting with painless right hand, arm weakness since 2013,on examination, she has significant atrophy of right hand muscles, mild right arm muscle atrophy, hyperreflexia, mild neck flexion weakness, facial muscle weakness, also mild left upper extremity, bilateral lower extremity weakness. Today's electrodiagnostic study has demonstrated widespread active neuropathic changes involving bilateral cervical, lumbar sacral myotomes. Mild chronic neuropathic changes involving right sternocleidomastoid, she has multilevel lumbar degenerative changes, moderate to severe stenosis at L3-4, L4-5 level, no significant abnormality at MRI cervical spine, thoracic spines, MRI of the  brain to explain her widespread chronic neuropathic changes,  1,. Most consistent with motor neuron disease, 2, I will refer her to do ALS clinic 3, CT chest to rule out paraneoplastic syndrome 4, return to clinic in 4 months     Levert FeinsteinYijun Brennyn Ortlieb, M.D. Ph.D.  [facility] [address], [address] [place], [place] [address] Ph: [phone] Fax: [phone]

## 2014-12-27 NOTE — Patient Instructions (Signed)
[facility] [address] [place], [place] [address] Phone: [phone]  Appointments: Click Here

## 2014-12-30 ENCOUNTER — Ambulatory Visit: Payer: Medicare Other | Admitting: Neurology

## 2015-01-01 LAB — C-REACTIVE PROTEIN: CRP: 1.9 mg/L (ref 0.0–4.9)

## 2015-01-01 LAB — PROTEIN ELECTROPHORESIS
A/G RATIO SPE: 1.2 (ref 0.7–2.0)
ALBUMIN ELP: 3.5 g/dL (ref 3.2–5.6)
Alpha 1: 0.2 g/dL (ref 0.1–0.4)
Alpha 2: 0.8 g/dL (ref 0.4–1.2)
Beta: 1 g/dL (ref 0.6–1.3)
GLOBULIN, TOTAL: 2.9 g/dL (ref 2.0–4.5)
Gamma Globulin: 0.9 g/dL (ref 0.5–1.6)
TOTAL PROTEIN: 6.4 g/dL (ref 6.0–8.5)

## 2015-01-01 LAB — RPR: RPR Ser Ql: NONREACTIVE

## 2015-01-01 LAB — VITAMIN B12: Vitamin B-12: 425 pg/mL (ref 211–946)

## 2015-01-01 LAB — ANA W/REFLEX IF POSITIVE: Anti Nuclear Antibody(ANA): NEGATIVE

## 2015-01-01 LAB — COPPER, SERUM

## 2015-01-01 LAB — B. BURGDORFI ANTIBODIES

## 2015-01-01 LAB — FOLATE

## 2015-01-01 LAB — CK: Total CK: 212 U/L — ABNORMAL HIGH (ref 24–173)

## 2015-01-01 LAB — SEDIMENTATION RATE: Sed Rate: 7 mm/hr (ref 0–40)

## 2015-01-01 LAB — ACETYLCHOLINE RECEPTOR, BINDING: AChR Binding Ab, Serum: <0.03 nmol/L (ref 0.00–0.24)

## 2015-01-01 LAB — TSH: TSH: 5.98 u[IU]/mL — AB (ref 0.450–4.500)

## 2015-01-01 LAB — ANGIOTENSIN CONVERTING ENZYME: Angio Convert Enzyme: 51 U/L (ref 14–82)

## 2015-01-03 LAB — COMPREHENSIVE METABOLIC PANEL
ALT: 29 IU/L (ref 0–32)
AST: 26 IU/L (ref 0–40)
Albumin/Globulin Ratio: 1.6 (ref 1.1–2.5)
Albumin: 3.9 g/dL (ref 3.5–4.8)
Alkaline Phosphatase: 76 IU/L (ref 39–117)
BILIRUBIN TOTAL: 0.3 mg/dL (ref 0.0–1.2)
BUN/Creatinine Ratio: 34 — ABNORMAL HIGH (ref 11–26)
BUN: 27 mg/dL (ref 8–27)
CHLORIDE: 100 mmol/L (ref 97–108)
CO2: 27 mmol/L (ref 18–29)
CREATININE: 0.8 mg/dL (ref 0.57–1.00)
Calcium: 9.2 mg/dL (ref 8.7–10.3)
GFR calc non Af Amer: 70 mL/min/{1.73_m2} (ref >59)
GFR, EST AFRICAN AMERICAN: 81 mL/min/{1.73_m2} (ref >59)
GLOBULIN, TOTAL: 2.4 g/dL (ref 1.5–4.5)
Glucose: 87 mg/dL (ref 65–99)
Potassium: 4.5 mmol/L (ref 3.5–5.2)
SODIUM: 140 mmol/L (ref 134–144)
Total Protein: 6.3 g/dL (ref 6.0–8.5)

## 2015-01-03 LAB — CBC
HEMATOCRIT: 35.1 % (ref 34.0–46.6)
Hemoglobin: 11.5 g/dL (ref 11.1–15.9)
MCH: 29.3 pg (ref 26.6–33.0)
MCHC: 32.8 g/dL (ref 31.5–35.7)
MCV: 90 fL (ref 79–97)
PLATELETS: 146 10*3/uL — AB (ref 150–379)
RBC: 3.92 x10E6/uL (ref 3.77–5.28)
RDW: 14.2 % (ref 12.3–15.4)
WBC: 5.8 10*3/uL (ref 3.4–10.8)

## 2015-01-03 LAB — THYROID PANEL WITH TSH
Free Thyroxine Index: 2.1 (ref 1.2–4.9)
T3 Uptake Ratio: 32 % (ref 24–39)
T4, Total: 6.5 ug/dL (ref 4.5–12.0)
TSH: 6.11 u[IU]/mL — AB (ref 0.450–4.500)

## 2015-01-03 LAB — ACETYLCHOLINE RECEPTOR, MODULATING

## 2015-01-13 ENCOUNTER — Ambulatory Visit
Admission: RE | Admit: 2015-01-13 | Discharge: 2015-01-13 | Disposition: A | Discharge status: Home / Self Care | Payer: Medicare Other | Source: Ambulatory Visit | Attending: Neurology | Admitting: Neurology

## 2015-01-13 DIAGNOSIS — R911 Solitary pulmonary nodule: Secondary | ICD-10-CM | POA: Diagnosis not present

## 2015-01-13 DIAGNOSIS — G1220 Motor neuron disease, unspecified: Secondary | ICD-10-CM

## 2015-01-13 DIAGNOSIS — M6281 Muscle weakness (generalized): Secondary | ICD-10-CM

## 2015-01-14 ENCOUNTER — Telehealth: Payer: Self-pay | Admitting: Neurology

## 2015-01-14 MED ORDER — RILUZOLE 50 MG PO TABS
50.0000 mg | ORAL_TABLET | Freq: Two times a day (BID) | ORAL | Status: DC
Start: 2015-01-14 — End: 2016-12-12

## 2015-01-14 NOTE — Telephone Encounter (Signed)
Left message for her daughter, Lennox LaityJodi, to return my call.

## 2015-01-14 NOTE — Telephone Encounter (Signed)
Spoke to ParkerfieldJodi - she is aware of Ct results - also, faxed the results to her PCP.  Lennox LaityJodi said the ALS Clinic could not see her mother until 05/27/15.  She wanted to know if you could start her on the medication you discussed with them at her last visit while they are waiting for her appointment.

## 2015-01-14 NOTE — Telephone Encounter (Signed)
Barbara Collins: Please call her, CT chest showed 2mm left lower lobe nodule, not related to her current symptoms     No acute intra thoracic abnormalities.  Extensive atherosclerotic calcification.  Nonspecific 2 mm LEFT lower lobe nodule, recommendation below.  Given risk factors for bronchogenic carcinoma (former smoker), follow-up chest CT at 1 year is recommended. This recommendation follows the consensus statement: Guidelines for Management of Small Pulmonary Nodules Detected on CT Scans: A Statement from the Fleischner Society as published in Radiology 2005; 237:395-400.

## 2015-01-14 NOTE — Telephone Encounter (Signed)
I have called Barbara Collins, I started her on rilutek 50 mg twice a day, potential side effect was explained to her. [facility]

## 2015-04-09 ENCOUNTER — Other Ambulatory Visit: Payer: Self-pay | Admitting: Family Medicine

## 2015-04-09 DIAGNOSIS — E2839 Other primary ovarian failure: Secondary | ICD-10-CM

## 2015-04-09 DIAGNOSIS — Z1231 Encounter for screening mammogram for malignant neoplasm of breast: Secondary | ICD-10-CM

## 2015-05-01 ENCOUNTER — Ambulatory Visit: Payer: Medicare Other | Admitting: Neurology

## 2015-05-06 DIAGNOSIS — M6281 Muscle weakness (generalized): Secondary | ICD-10-CM | POA: Diagnosis not present

## 2015-05-06 DIAGNOSIS — G1221 Amyotrophic lateral sclerosis: Secondary | ICD-10-CM | POA: Diagnosis not present

## 2015-05-06 DIAGNOSIS — R471 Dysarthria and anarthria: Secondary | ICD-10-CM | POA: Diagnosis not present

## 2015-05-06 DIAGNOSIS — G629 Polyneuropathy, unspecified: Secondary | ICD-10-CM | POA: Diagnosis not present

## 2015-05-06 DIAGNOSIS — Z8679 Personal history of other diseases of the circulatory system: Secondary | ICD-10-CM | POA: Diagnosis not present

## 2015-05-06 DIAGNOSIS — Z951 Presence of aortocoronary bypass graft: Secondary | ICD-10-CM | POA: Diagnosis not present

## 2015-05-06 DIAGNOSIS — R942 Abnormal results of pulmonary function studies: Secondary | ICD-10-CM | POA: Diagnosis not present

## 2015-05-06 DIAGNOSIS — J984 Other disorders of lung: Secondary | ICD-10-CM | POA: Diagnosis not present

## 2015-05-06 DIAGNOSIS — Z79899 Other long term (current) drug therapy: Secondary | ICD-10-CM | POA: Diagnosis not present

## 2015-05-06 DIAGNOSIS — R262 Difficulty in walking, not elsewhere classified: Secondary | ICD-10-CM | POA: Diagnosis not present

## 2015-05-06 DIAGNOSIS — R5381 Other malaise: Secondary | ICD-10-CM | POA: Diagnosis not present

## 2015-06-17 DIAGNOSIS — E559 Vitamin D deficiency, unspecified: Secondary | ICD-10-CM | POA: Diagnosis not present

## 2015-06-17 DIAGNOSIS — R262 Difficulty in walking, not elsewhere classified: Secondary | ICD-10-CM | POA: Diagnosis not present

## 2015-06-17 DIAGNOSIS — J984 Other disorders of lung: Secondary | ICD-10-CM | POA: Diagnosis not present

## 2015-06-17 DIAGNOSIS — Z5181 Encounter for therapeutic drug level monitoring: Secondary | ICD-10-CM | POA: Diagnosis not present

## 2015-06-17 DIAGNOSIS — M6281 Muscle weakness (generalized): Secondary | ICD-10-CM | POA: Diagnosis not present

## 2015-06-17 DIAGNOSIS — G1221 Amyotrophic lateral sclerosis: Secondary | ICD-10-CM | POA: Diagnosis not present

## 2015-06-17 DIAGNOSIS — Z23 Encounter for immunization: Secondary | ICD-10-CM | POA: Diagnosis not present

## 2015-06-17 DIAGNOSIS — G629 Polyneuropathy, unspecified: Secondary | ICD-10-CM | POA: Diagnosis not present

## 2015-06-17 DIAGNOSIS — R1312 Dysphagia, oropharyngeal phase: Secondary | ICD-10-CM | POA: Diagnosis not present

## 2015-06-17 DIAGNOSIS — R5381 Other malaise: Secondary | ICD-10-CM | POA: Diagnosis not present

## 2015-06-19 DIAGNOSIS — M6281 Muscle weakness (generalized): Secondary | ICD-10-CM | POA: Diagnosis not present

## 2015-06-19 DIAGNOSIS — R1312 Dysphagia, oropharyngeal phase: Secondary | ICD-10-CM | POA: Diagnosis not present

## 2015-06-19 DIAGNOSIS — R5381 Other malaise: Secondary | ICD-10-CM | POA: Diagnosis not present

## 2015-06-19 DIAGNOSIS — G1221 Amyotrophic lateral sclerosis: Secondary | ICD-10-CM | POA: Diagnosis not present

## 2015-06-19 DIAGNOSIS — R262 Difficulty in walking, not elsewhere classified: Secondary | ICD-10-CM | POA: Diagnosis not present

## 2015-07-31 DIAGNOSIS — E785 Hyperlipidemia, unspecified: Secondary | ICD-10-CM | POA: Diagnosis not present

## 2015-07-31 DIAGNOSIS — I2510 Atherosclerotic heart disease of native coronary artery without angina pectoris: Secondary | ICD-10-CM | POA: Diagnosis not present

## 2015-07-31 DIAGNOSIS — I10 Essential (primary) hypertension: Secondary | ICD-10-CM | POA: Diagnosis not present

## 2015-07-31 DIAGNOSIS — I350 Nonrheumatic aortic (valve) stenosis: Secondary | ICD-10-CM | POA: Diagnosis not present

## 2015-08-14 DIAGNOSIS — E785 Hyperlipidemia, unspecified: Secondary | ICD-10-CM | POA: Diagnosis not present

## 2015-08-14 DIAGNOSIS — Z79899 Other long term (current) drug therapy: Secondary | ICD-10-CM | POA: Diagnosis not present

## 2015-08-14 DIAGNOSIS — I10 Essential (primary) hypertension: Secondary | ICD-10-CM | POA: Diagnosis not present

## 2015-08-14 DIAGNOSIS — I2510 Atherosclerotic heart disease of native coronary artery without angina pectoris: Secondary | ICD-10-CM | POA: Diagnosis not present

## 2015-09-23 DIAGNOSIS — Z79899 Other long term (current) drug therapy: Secondary | ICD-10-CM | POA: Diagnosis not present

## 2015-09-23 DIAGNOSIS — R262 Difficulty in walking, not elsewhere classified: Secondary | ICD-10-CM | POA: Diagnosis not present

## 2015-09-23 DIAGNOSIS — R5381 Other malaise: Secondary | ICD-10-CM | POA: Diagnosis not present

## 2015-09-23 DIAGNOSIS — J984 Other disorders of lung: Secondary | ICD-10-CM | POA: Diagnosis not present

## 2015-09-23 DIAGNOSIS — M6281 Muscle weakness (generalized): Secondary | ICD-10-CM | POA: Diagnosis not present

## 2015-09-23 DIAGNOSIS — G1221 Amyotrophic lateral sclerosis: Secondary | ICD-10-CM | POA: Diagnosis not present

## 2015-12-30 DIAGNOSIS — M6281 Muscle weakness (generalized): Secondary | ICD-10-CM | POA: Diagnosis not present

## 2015-12-30 DIAGNOSIS — J984 Other disorders of lung: Secondary | ICD-10-CM | POA: Diagnosis not present

## 2015-12-30 DIAGNOSIS — G709 Myoneural disorder, unspecified: Secondary | ICD-10-CM | POA: Diagnosis not present

## 2015-12-30 DIAGNOSIS — R262 Difficulty in walking, not elsewhere classified: Secondary | ICD-10-CM | POA: Diagnosis not present

## 2015-12-30 DIAGNOSIS — G1221 Amyotrophic lateral sclerosis: Secondary | ICD-10-CM | POA: Diagnosis not present

## 2015-12-30 DIAGNOSIS — R29898 Other symptoms and signs involving the musculoskeletal system: Secondary | ICD-10-CM | POA: Diagnosis not present

## 2015-12-30 DIAGNOSIS — Z5181 Encounter for therapeutic drug level monitoring: Secondary | ICD-10-CM | POA: Diagnosis not present

## 2016-03-08 ENCOUNTER — Ambulatory Visit (HOSPITAL_COMMUNITY): Payer: Medicare Other

## 2016-03-08 ENCOUNTER — Encounter (HOSPITAL_COMMUNITY): Payer: Self-pay | Admitting: Emergency Medicine

## 2016-03-08 ENCOUNTER — Ambulatory Visit (HOSPITAL_COMMUNITY)
Admission: EM | Admit: 2016-03-08 | Discharge: 2016-03-08 | Disposition: A | Discharge status: Home / Self Care | Payer: Medicare Other | Attending: Emergency Medicine | Admitting: Emergency Medicine

## 2016-03-08 DIAGNOSIS — W1830XA Fall on same level, unspecified, initial encounter: Secondary | ICD-10-CM | POA: Insufficient documentation

## 2016-03-08 DIAGNOSIS — S52591A Other fractures of lower end of right radius, initial encounter for closed fracture: Secondary | ICD-10-CM | POA: Insufficient documentation

## 2016-03-08 DIAGNOSIS — S62609A Fracture of unspecified phalanx of unspecified finger, initial encounter for closed fracture: Secondary | ICD-10-CM | POA: Diagnosis not present

## 2016-03-08 DIAGNOSIS — S52501A Unspecified fracture of the lower end of right radius, initial encounter for closed fracture: Secondary | ICD-10-CM | POA: Diagnosis not present

## 2016-03-08 DIAGNOSIS — S52201A Unspecified fracture of shaft of right ulna, initial encounter for closed fracture: Secondary | ICD-10-CM

## 2016-03-08 DIAGNOSIS — M25531 Pain in right wrist: Secondary | ICD-10-CM | POA: Diagnosis present

## 2016-03-08 DIAGNOSIS — M8580 Other specified disorders of bone density and structure, unspecified site: Secondary | ICD-10-CM | POA: Insufficient documentation

## 2016-03-08 DIAGNOSIS — S62001A Unspecified fracture of navicular [scaphoid] bone of right wrist, initial encounter for closed fracture: Secondary | ICD-10-CM | POA: Diagnosis not present

## 2016-03-08 DIAGNOSIS — S52691A Other fracture of lower end of right ulna, initial encounter for closed fracture: Secondary | ICD-10-CM | POA: Insufficient documentation

## 2016-03-08 DIAGNOSIS — S62642A Nondisplaced fracture of proximal phalanx of right middle finger, initial encounter for closed fracture: Secondary | ICD-10-CM | POA: Insufficient documentation

## 2016-03-08 MED ORDER — ACETAMINOPHEN 500 MG PO TABS
1000.0000 mg | ORAL_TABLET | Freq: Once | ORAL | Status: AC
  Administered 2016-03-08: 1000 mg via ORAL

## 2016-03-08 MED ORDER — ACETAMINOPHEN 325 MG PO TABS
ORAL_TABLET | ORAL | Status: AC
  Filled 2016-03-08: qty 3

## 2016-03-08 MED ORDER — TRAMADOL HCL 50 MG PO TABS: ORAL_TABLET | ORAL | Status: DC

## 2016-03-08 MED ORDER — ACETAMINOPHEN 325 MG PO TABS: 650.0000 mg | ORAL_TABLET | Freq: Once | ORAL | Status: DC

## 2016-03-08 NOTE — ED Notes (Signed)
Ice pack provided

## 2016-03-08 NOTE — ED Provider Notes (Signed)
HPI  SUBJECTIVE:  Barbara Collins is a left-handed 82 y.o. female who presents with throbbing, intermittent right wrist pain, hand swelling, bruising after having a trip and fall onto her outstretched right hand last night. Reports limitation of motion of her fingers due to the edema. She denies chest pain, palpitations, shortness of breath causing her fall. No headache, loss of consciousness, denies head injury. She has tried 400 mg ibuprofen and ice with some improvement in her symptoms. Symptoms are somewhat better with elevation. Symptoms are worse with supination and with hanging her hand in a dependent position. He denies numbness, tingling in her hand. No change in her baseline hand or wrist weakness. She has a past medical history of ALS, right finger swelling, right hand weakness for which no cause was found, hypertension, coronary artery disease status post CABG. No history of diabetes, peripheral vascular disease, kidney disease. She is on aspirin 81 mg daily, no other anticoagulant or antiplatelet use. PMD: Dr. Jillyn HiddenFulp  Past Medical History  Diagnosis Date  . Hypertension   . Hyperlipemia   . Neuropathy (HCC)     Feet  . Right arm weakness   . Aortic stenosis   . Lumbar spinal stenosis   . CAD (coronary artery disease)     Past Surgical History  Procedure Laterality Date  . Cardiac surgery      2005  . Cesarean section    . Cholecystectomy    . Cataract extraction      Bilateral  . Hand surgery      Right    Family History  Problem Relation Age of Onset  . Hypertension Mother     Social History  Substance Use Topics  . Smoking status: Former Games developermoker  . Smokeless tobacco: None  . Alcohol Use: No     Comment: Rare    No current facility-administered medications for this encounter.  Current outpatient prescriptions:  .  Ascorbic Acid (VITAMIN C PO), Take by mouth., Disp: , Rfl:  .  aspirin EC 81 MG tablet, Take 81 mg by mouth daily., Disp: , Rfl:  .  atorvastatin  (LIPITOR) 20 MG tablet, Take 20 mg by mouth daily., Disp: , Rfl:  .  ezetimibe (ZETIA) 10 MG tablet, Take 10 mg by mouth every other day., Disp: , Rfl:  .  famotidine (PEPCID AC) 10 MG chewable tablet, Chew 10 mg by mouth as needed for heartburn., Disp: , Rfl:  .  Fish Oil-Cholecalciferol (FISH OIL + D3 PO), Take by mouth., Disp: , Rfl:  .  gabapentin (NEURONTIN) 100 MG capsule, Take 100 mg by mouth. Two capsules twice daily., Disp: , Rfl:  .  metoprolol (LOPRESSOR) 50 MG tablet, Take 50 mg by mouth. 0.5 tablet twice daily, Disp: , Rfl:  .  potassium chloride (K-DUR,KLOR-CON) 10 MEQ tablet, Take 10 mEq by mouth 2 (two) times daily., Disp: , Rfl:  .  riluzole (RILUTEK) 50 MG tablet, Take 1 tablet (50 mg total) by mouth every 12 (twelve) hours., Disp: 60 tablet, Rfl: 11 .  traMADol (ULTRAM) 50 MG tablet, 1 tabs po q 6 hr prn pain Maximum dose= 8 tablets per day, Disp: 20 tablet, Rfl: 0 .  valsartan-hydrochlorothiazide (DIOVAN-HCT) 80-12.5 MG per tablet, Take 1 tablet by mouth daily., Disp: , Rfl:   No Known Allergies   ROS  As noted in HPI.   Physical Exam  BP 157/73 mmHg  Pulse 78  Temp(Src) 97.9 F (36.6 C) (Oral)  Resp 16  SpO2 97%  Constitutional: Well developed, well nourished, no acute distress Eyes:  EOMI, conjunctiva normal bilaterally HENT: Normocephalic, atraumatic,mucus membranes moist Respiratory: Normal inspiratory effort Cardiovascular: Normal rate GI: nondistended skin: No rash, skin intact Musculoskeletal: R  distal radius tender, positive swelling volar aspect of wrist. Pain with supination, ulnar deviation. Patient states that the weakness in her wrist is not new. distal ulnar styloid NT, snuffbox NT, carpals NT, metacarpals NT, tenderness over the proximal phalanx index finger, extensive swelling all fingers, bruising, dorsum and on the palm of the hand, bruising over the index, middle, ring fingers. Decreased range of motion of fingers.  Sensation LT to hand normal  , 2 point discrimination intact in all fingers, CR<2 seconds distally.  Shoulder and upper arm NT, Elbow and proximal forearm NT on affected extremity.      Neurologic: Alert & oriented x 3, no focal neuro deficits Psychiatric: Speech and behavior appropriate   ED Course   Medications - No data to display  Orders Placed This Encounter  Procedures  . DG Wrist Complete Right    Standing Status: Standing     Number of Occurrences: 1     Standing Expiration Date:     Order Specific Question:  Reason for Exam (SYMPTOM  OR DIAGNOSIS REQUIRED)    Answer:  fall yesterday bruising radial tenderness edema r/o fx dislocation  . DG Hand Complete Right    Standing Status: Standing     Number of Occurrences: 1     Standing Expiration Date:     Order Specific Question:  Reason for Exam (SYMPTOM  OR DIAGNOSIS REQUIRED)    Answer:  fall yesterday bruising radial tenderness edema r/o fx dislocation  . Apply other splint    Standing Status: Standing     Number of Occurrences: 1     Standing Expiration Date:     Order Specific Question:  Laterality    Answer:  Right    No results found for this or any previous visit (from the past 24 hour(s)).  Dg Wrist Complete Right  03/08/2016  CLINICAL DATA:  Fall yesterday with right wrist pain, initial encounter EXAM: RIGHT WRIST - COMPLETE 3+ VIEW COMPARISON:  None. FINDINGS: Generalized osteopenia is noted. There is evidence of transverse fractures through the distal radius and distal ulna in the metaphysis with some impaction and posterior angulation of the radial fracture. Mild irregularity of the lateral aspect of the scaphoid is noted on the cone-down view only this may represent an undisplaced fracture. A fracture through the midshaft of the third proximal phalanx is noted. IMPRESSION: Distal radial and ulnar fractures with impaction and posterior angulation. There are changes suggestive of a scaphoid fracture. CT may be helpful for further evaluation  as clinically indicated. Third proximal phalangeal fracture Electronically Signed   By: Alcide Clever M.D.   On: 03/08/2016 16:50   Dg Hand Complete Right  03/08/2016  CLINICAL DATA:  Fall yesterday with persistent hand pain, initial encounter EXAM: RIGHT HAND - COMPLETE 3+ VIEW COMPARISON:  None. FINDINGS: Generalized osteopenia is identified. Degenerative changes are noted in the interphalangeal joints. Transverse fracture is noted through the third proximal phalanx. The distal radial and ulnar fractures are noted as well. No other focal abnormality is seen. IMPRESSION: Third proximal phalangeal fracture. Distal radial and ulnar fractures. Electronically Signed   By: Alcide Clever M.D.   On: 03/08/2016 16:51    ED Clinical Impression  Distal radius fracture, right, closed, initial encounter  Ulnar fracture, right, closed,  initial encounter  Phalanx (hand) fracture, closed, initial encounter  Scaphoid fracture of wrist, right, closed, initial encounter   ED Assessment/Plan  Reviewed Imaging independently. Positive distal radial and ulnar fracture with impaction and posterior angulation. Changes suggestive of a scaphoid fracture. Third proximal phalangeal fracture. See radiology report for full details.  Discussed case with Dr. Hyacinth Meeker. reconmmended Sugar tong splint, tramadol. His office will call her tomorrow to arrange follow-up within a week.  Discussed  imaging, MDM, plan and followup with patient. Discussed sn/sx that should prompt return to the ED. Patient agrees with plan.   *This clinic note was created using Dragon dictation software. Therefore, there may be occasional mistakes despite careful proofreading.  ?   Domenick Gong, MD 03/08/16 1737

## 2016-03-08 NOTE — ED Notes (Signed)
Patient fell on hard wood floor last night, tripped over dog.  Right radial pulse is 2 +.  Patient skin is warm, hand swollen

## 2016-03-08 NOTE — ED Notes (Signed)
Delay in discharging patient due to ortho tech applying sugar tong splint.

## 2016-03-08 NOTE — Progress Notes (Signed)
Orthopedic Tech Progress Note Patient Details:  Barbara PateeHazel Collins [birth year] [id number]  Ortho Devices Type of Ortho Device: Arm sling, Sugartong splint, Ace wrap Ortho Device/Splint Interventions: Application   Saul FordyceJennifer C Alyzza Andringa 03/08/2016, 6:22 PM

## 2016-03-08 NOTE — ED Notes (Signed)
Notified ortho tech of needs for splint, ortho tech is coming.

## 2016-03-08 NOTE — Discharge Instructions (Signed)
You may take 1 g of Tylenol up to 4 times a day as needed for pain. Ice this for 20 minutes at a time. Keep it elevated above your heart as much as possible. Be careful with the tramadol, as it can make you sleepy and increase your risk for falls. Follow up with Dr. Janee Mornhompson. His office will contact you tomorrow to arrange a follow-up. If you have not heard from them by Wednesday, call them to arrange an appointment. Go to the ER for the signs and symptoms we discussed

## 2016-03-15 DIAGNOSIS — S52551A Other extraarticular fracture of lower end of right radius, initial encounter for closed fracture: Secondary | ICD-10-CM | POA: Diagnosis not present

## 2016-03-29 DIAGNOSIS — S52551D Other extraarticular fracture of lower end of right radius, subsequent encounter for closed fracture with routine healing: Secondary | ICD-10-CM | POA: Diagnosis not present

## 2016-04-06 DIAGNOSIS — M6281 Muscle weakness (generalized): Secondary | ICD-10-CM | POA: Diagnosis not present

## 2016-04-06 DIAGNOSIS — G1221 Amyotrophic lateral sclerosis: Secondary | ICD-10-CM | POA: Diagnosis not present

## 2016-04-06 DIAGNOSIS — R262 Difficulty in walking, not elsewhere classified: Secondary | ICD-10-CM | POA: Diagnosis not present

## 2016-04-06 DIAGNOSIS — R1319 Other dysphagia: Secondary | ICD-10-CM | POA: Diagnosis not present

## 2016-04-06 DIAGNOSIS — G709 Myoneural disorder, unspecified: Secondary | ICD-10-CM | POA: Diagnosis not present

## 2016-04-06 DIAGNOSIS — R5381 Other malaise: Secondary | ICD-10-CM | POA: Diagnosis not present

## 2016-04-06 DIAGNOSIS — Z79899 Other long term (current) drug therapy: Secondary | ICD-10-CM | POA: Diagnosis not present

## 2016-04-06 DIAGNOSIS — R5383 Other fatigue: Secondary | ICD-10-CM | POA: Diagnosis not present

## 2016-04-06 DIAGNOSIS — R0689 Other abnormalities of breathing: Secondary | ICD-10-CM | POA: Diagnosis not present

## 2016-04-06 DIAGNOSIS — Z5181 Encounter for therapeutic drug level monitoring: Secondary | ICD-10-CM | POA: Diagnosis not present

## 2016-04-06 DIAGNOSIS — J984 Other disorders of lung: Secondary | ICD-10-CM | POA: Diagnosis not present

## 2016-04-07 DIAGNOSIS — R262 Difficulty in walking, not elsewhere classified: Secondary | ICD-10-CM | POA: Diagnosis not present

## 2016-04-07 DIAGNOSIS — G1221 Amyotrophic lateral sclerosis: Secondary | ICD-10-CM | POA: Diagnosis not present

## 2016-04-07 DIAGNOSIS — M6281 Muscle weakness (generalized): Secondary | ICD-10-CM | POA: Diagnosis not present

## 2016-04-07 DIAGNOSIS — R5381 Other malaise: Secondary | ICD-10-CM | POA: Diagnosis not present

## 2016-05-14 ENCOUNTER — Ambulatory Visit (HOSPITAL_COMMUNITY)
Admission: EM | Admit: 2016-05-14 | Discharge: 2016-05-14 | Disposition: A | Discharge status: Home / Self Care | Payer: Medicare Other | Attending: Family Medicine | Admitting: Family Medicine

## 2016-05-14 ENCOUNTER — Ambulatory Visit (INDEPENDENT_AMBULATORY_CARE_PROVIDER_SITE_OTHER): Payer: Medicare Other

## 2016-05-14 ENCOUNTER — Encounter (HOSPITAL_COMMUNITY): Payer: Self-pay | Admitting: *Deleted

## 2016-05-14 DIAGNOSIS — S60221A Contusion of right hand, initial encounter: Secondary | ICD-10-CM

## 2016-05-14 DIAGNOSIS — S52501D Unspecified fracture of the lower end of right radius, subsequent encounter for closed fracture with routine healing: Secondary | ICD-10-CM | POA: Diagnosis not present

## 2016-05-14 DIAGNOSIS — S52601D Unspecified fracture of lower end of right ulna, subsequent encounter for closed fracture with routine healing: Secondary | ICD-10-CM | POA: Diagnosis not present

## 2016-05-14 HISTORY — DX: Amyotrophic lateral sclerosis: G12.21

## 2016-05-14 NOTE — ED Triage Notes (Signed)
Reports fall this afternoon after stumbling over an umbrella on her porch.  Denies any head injuries or any other injuries.  C/O right hand pain, swelling.  Pt has poor use of that extremity due to ALS.  All fingers warm, pink, with prompt cap refill.

## 2016-05-14 NOTE — Discharge Instructions (Signed)
Recommend call your Orthopedic Hand Surgeon for further evaluation and follow-up.

## 2016-05-15 NOTE — ED Provider Notes (Signed)
CSN: [id number]     Arrival date & time 05/14/16  1909 History   First MD Initiated Contact with Patient 05/14/16 2208     Chief Complaint  Patient presents with  . Hand Injury   (Consider location/radiation/quality/duration/timing/severity/associated sxs/prior Treatment) 82 year old female presents with right hand injury after a fall this afternoon She tripped over an item on her porch while watering the plants and landed on her right palm. She has very limited use of her right hand normally due to ALS. She is unable to bend her hand or thumb and has minimal use of her fingers and lower arm. Back of hand is bruised as well as central palm area and has mild pain and swelling. She had injured this same hand on 03/08/16 with multiple fractures and concerned over re-injury today. She denies any other injuries to her body.    The history is provided by the patient and a relative.    Past Medical History:  Diagnosis Date  . ALS (amyotrophic lateral sclerosis) (HCC)   . Aortic stenosis   . CAD (coronary artery disease)   . Hyperlipemia   . Hypertension   . Lumbar spinal stenosis   . Neuropathy (HCC)    Feet  . Right arm weakness    Past Surgical History:  Procedure Laterality Date  . CARDIAC SURGERY     2005  . CATARACT EXTRACTION     Bilateral  . CESAREAN SECTION    . CHOLECYSTECTOMY    . HAND SURGERY     Right   Family History  Problem Relation Age of Onset  . Hypertension Mother    Social History  Substance Use Topics  . Smoking status: Former Games developer  . Smokeless tobacco: Not on file  . Alcohol use No     Comment: Rare   OB History    No data available     Review of Systems  Constitutional: Negative for activity change and fatigue.  Eyes: Negative for visual disturbance.  Cardiovascular: Negative for chest pain.  Musculoskeletal: Positive for arthralgias and joint swelling.  Skin: Positive for color change.  Neurological: Positive for weakness. Negative for  dizziness, seizures and speech difficulty.  Hematological: Bruises/bleeds easily.    Allergies  Codeine  Home Medications   Prior to Admission medications   Medication Sig Start Date End Date Taking? Authorizing Provider  Ascorbic Acid (VITAMIN C PO) Take by mouth.   Yes Historical Provider, MD  aspirin EC 81 MG tablet Take 81 mg by mouth daily.   Yes Historical Provider, MD  Cholecalciferol (VITAMIN D PO) Take by mouth.   Yes Historical Provider, MD  famotidine (PEPCID AC) 10 MG chewable tablet Chew 10 mg by mouth as needed for heartburn.   Yes Historical Provider, MD  metoprolol (LOPRESSOR) 50 MG tablet Take 50 mg by mouth. 0.5 tablet twice daily   Yes Historical Provider, MD  potassium chloride (K-DUR,KLOR-CON) 10 MEQ tablet Take 10 mEq by mouth 2 (two) times daily.   Yes Historical Provider, MD  riluzole (RILUTEK) 50 MG tablet Take 1 tablet (50 mg total) by mouth every 12 (twelve) hours. 01/14/15  Yes Levert Feinstein, MD  valsartan-hydrochlorothiazide (DIOVAN-HCT) 80-12.5 MG per tablet Take 1 tablet by mouth daily.   Yes Historical Provider, MD  traMADol (ULTRAM) 50 MG tablet 1 tabs po q 6 hr prn pain Maximum dose= 8 tablets per day 03/08/16   Domenick Gong, MD   Meds Ordered and Administered this Visit  Medications -  No data to display  BP 154/59 (BP Location: Left Arm)   Temp 97.9 F (36.6 C) (Oral)   SpO2 96%  No data found.   Physical Exam  Constitutional: She is oriented to person, place, and time. She appears well-developed and well-nourished. No distress.  Musculoskeletal: She exhibits edema and tenderness.       Right hand: She exhibits tenderness and swelling. She exhibits normal range of motion, normal capillary refill and no laceration. Decreased sensation noted. Decreased sensation is present in the ulnar distribution and is present in the radial distribution. Decreased strength noted.       Hands: Slight bruising and swelling of central lower area of right palm.  Tender. All fingers are swollen and have passive range of motion but limited active range of motion mostly with 2nd and 3rd finger. No movement of thumb.  Ecchymosis, swelling of right central dorsal aspect of hand and tender. Pulses are normal and good capillary refill.  Neurological: She is alert and oriented to person, place, and time. She displays atrophy (right arm and hand). A sensory deficit is present.  Skin: Skin is warm and dry. Capillary refill takes less than 2 seconds.  Psychiatric: She has a normal mood and affect. Her behavior is normal. Judgment and thought content normal.    Urgent Care Course   Clinical Course    Procedures (including critical care time)  Labs Review Labs Reviewed - No data to display  Imaging Review Dg Hand Complete Right  Result Date: 05/14/2016 CLINICAL DATA:  Status post fall, with right hand pain. Bruising and swelling about the palm and thumb. Initial encounter. EXAM: RIGHT HAND - COMPLETE 3+ VIEW COMPARISON:  Right hand radiographs from 03/08/2016 FINDINGS: There is no evidence of acute fracture or dislocation. Previously noted mildly impacted fractures of the distal radius and ulna are again noted, with interval healing. There appears to be some degree of healing of the proximal third phalanx fracture. There is diffuse osteopenia of visualized osseous structures. The joint spaces are preserved. The carpal rows are intact, and demonstrate normal alignment. Diffuse soft tissue swelling is noted about the dorsal hand and wrist. IMPRESSION: 1. No evidence of acute fracture or dislocation. 2. Some degree of healing noted with regard to the mildly impacted fractures of the distal radius and ulna, and the fracture of the proximal third phalanx, seen on prior studies. 3. Diffuse soft tissue swelling about the dorsal hand and wrist. Electronically Signed   By: Roanna RaiderJeffery  Chang M.D.   On: 05/14/2016 21:56     Visual Acuity Review  Right Eye Distance:   Left  Eye Distance:   Bilateral Distance:    Right Eye Near:   Left Eye Near:    Bilateral Near:         MDM   1. Hand contusion, right, initial encounter    Reviewed x-ray results with patient and daughter. No new fracture noted. Offered medication for pain and swelling- patient has Tramadol at home if needed. Continue to apply ice/cool compresses for comfort. Follow-up with her Orthopedic Hand Surgeon next week for further evaluation.     Sudie GrumblingAnn Berry Kearstin Learn, NP 05/15/16 1310

## 2016-06-30 DIAGNOSIS — I10 Essential (primary) hypertension: Secondary | ICD-10-CM | POA: Diagnosis not present

## 2016-06-30 DIAGNOSIS — Z0000 Encounter for general adult medical examination without abnormal findings: Secondary | ICD-10-CM | POA: Diagnosis not present

## 2016-07-13 DIAGNOSIS — R29898 Other symptoms and signs involving the musculoskeletal system: Secondary | ICD-10-CM | POA: Diagnosis not present

## 2016-07-13 DIAGNOSIS — J984 Other disorders of lung: Secondary | ICD-10-CM | POA: Diagnosis not present

## 2016-07-13 DIAGNOSIS — Z5181 Encounter for therapeutic drug level monitoring: Secondary | ICD-10-CM | POA: Diagnosis not present

## 2016-07-13 DIAGNOSIS — R634 Abnormal weight loss: Secondary | ICD-10-CM | POA: Diagnosis not present

## 2016-07-13 DIAGNOSIS — G709 Myoneural disorder, unspecified: Secondary | ICD-10-CM | POA: Diagnosis not present

## 2016-07-13 DIAGNOSIS — R1319 Other dysphagia: Secondary | ICD-10-CM | POA: Diagnosis not present

## 2016-07-13 DIAGNOSIS — R5381 Other malaise: Secondary | ICD-10-CM | POA: Diagnosis not present

## 2016-07-13 DIAGNOSIS — R262 Difficulty in walking, not elsewhere classified: Secondary | ICD-10-CM | POA: Diagnosis not present

## 2016-07-13 DIAGNOSIS — Z87891 Personal history of nicotine dependence: Secondary | ICD-10-CM | POA: Diagnosis not present

## 2016-07-13 DIAGNOSIS — M6281 Muscle weakness (generalized): Secondary | ICD-10-CM | POA: Diagnosis not present

## 2016-07-13 DIAGNOSIS — Z23 Encounter for immunization: Secondary | ICD-10-CM | POA: Diagnosis not present

## 2016-07-13 DIAGNOSIS — R0602 Shortness of breath: Secondary | ICD-10-CM | POA: Diagnosis not present

## 2016-07-13 DIAGNOSIS — G1221 Amyotrophic lateral sclerosis: Secondary | ICD-10-CM | POA: Diagnosis not present

## 2016-08-30 ENCOUNTER — Ambulatory Visit (INDEPENDENT_AMBULATORY_CARE_PROVIDER_SITE_OTHER)
Admission: EM | Admit: 2016-08-30 | Discharge: 2016-08-30 | Disposition: A | Discharge status: Home / Self Care | Payer: Medicare Other | Source: Home / Self Care | Attending: Internal Medicine | Admitting: Internal Medicine

## 2016-08-30 ENCOUNTER — Encounter (HOSPITAL_COMMUNITY): Payer: Self-pay | Admitting: Emergency Medicine

## 2016-08-30 ENCOUNTER — Ambulatory Visit (INDEPENDENT_AMBULATORY_CARE_PROVIDER_SITE_OTHER): Payer: Medicare Other

## 2016-08-30 DIAGNOSIS — R0602 Shortness of breath: Secondary | ICD-10-CM | POA: Diagnosis not present

## 2016-08-30 DIAGNOSIS — R000 Tachycardia, unspecified: Secondary | ICD-10-CM

## 2016-08-30 DIAGNOSIS — Z66 Do not resuscitate: Secondary | ICD-10-CM | POA: Insufficient documentation

## 2016-08-30 DIAGNOSIS — R05 Cough: Secondary | ICD-10-CM | POA: Diagnosis not present

## 2016-08-30 DIAGNOSIS — J189 Pneumonia, unspecified organism: Secondary | ICD-10-CM | POA: Diagnosis not present

## 2016-08-30 DIAGNOSIS — Z79899 Other long term (current) drug therapy: Secondary | ICD-10-CM

## 2016-08-30 DIAGNOSIS — I519 Heart disease, unspecified: Secondary | ICD-10-CM | POA: Insufficient documentation

## 2016-08-30 DIAGNOSIS — G1221 Amyotrophic lateral sclerosis: Secondary | ICD-10-CM | POA: Insufficient documentation

## 2016-08-30 DIAGNOSIS — Z7982 Long term (current) use of aspirin: Secondary | ICD-10-CM

## 2016-08-30 DIAGNOSIS — E785 Hyperlipidemia, unspecified: Secondary | ICD-10-CM | POA: Diagnosis not present

## 2016-08-30 DIAGNOSIS — Z87891 Personal history of nicotine dependence: Secondary | ICD-10-CM | POA: Insufficient documentation

## 2016-08-30 DIAGNOSIS — J9601 Acute respiratory failure with hypoxia: Secondary | ICD-10-CM | POA: Diagnosis not present

## 2016-08-30 DIAGNOSIS — R0902 Hypoxemia: Secondary | ICD-10-CM | POA: Diagnosis not present

## 2016-08-30 DIAGNOSIS — I248 Other forms of acute ischemic heart disease: Secondary | ICD-10-CM | POA: Diagnosis not present

## 2016-08-30 DIAGNOSIS — J019 Acute sinusitis, unspecified: Secondary | ICD-10-CM | POA: Diagnosis not present

## 2016-08-30 DIAGNOSIS — I10 Essential (primary) hypertension: Secondary | ICD-10-CM | POA: Diagnosis not present

## 2016-08-30 LAB — D-DIMER, QUANTITATIVE: D-Dimer, Quant: 1.15 ug/mL-FEU — ABNORMAL HIGH (ref 0.00–0.50)

## 2016-08-30 MED ORDER — ENOXAPARIN SODIUM 80 MG/0.8ML ~~LOC~~ SOLN
70.0000 mg | Freq: Once | SUBCUTANEOUS | Status: AC
  Administered 2016-08-30: 70 mg via SUBCUTANEOUS
  Filled 2016-08-30: qty 0.8

## 2016-08-30 NOTE — ED Provider Notes (Addendum)
[facility]    CSN: [id number] Arrival date & time: 08/30/16  1336     History   Chief Complaint Chief Complaint  Patient presents with  . Shortness of Breath    HPI Barbara Collins is a 83 y.o. female. She has ALS and heart disease. Presents today with a 10 day history of cough, runny/congested nose. Became suddenly breathless overnight, new for her.  Slightly improved today.  Heart rate is 120s on exam. No fever. No unusual leg pain or swelling.  Patient is DNR due to ALS.  Terrible PFTs per daughter, but had not had a lot of symptoms up til now.      HPI  Past Medical History:  Diagnosis Date  . ALS (amyotrophic lateral sclerosis) (HCC)   . Aortic stenosis   . CAD (coronary artery disease)   . Hyperlipemia   . Hypertension   . Lumbar spinal stenosis   . Neuropathy (HCC)    Feet  . Right arm weakness     Patient Active Problem List   Diagnosis Date Noted  . Weakness 11/20/2014  . Arm muscle atrophy 11/20/2014  . Muscle weakness of right arm 11/20/2014    Past Surgical History:  Procedure Laterality Date  . CARDIAC SURGERY     2005  . CATARACT EXTRACTION     Bilateral  . CESAREAN SECTION    . CHOLECYSTECTOMY    . HAND SURGERY     Right       Home Medications    Prior to Admission medications   Medication Sig Start Date End Date Taking? Authorizing Provider  Ascorbic Acid (VITAMIN C PO) Take by mouth.   Yes Historical Provider, MD  aspirin EC 81 MG tablet Take 81 mg by mouth daily.   Yes Historical Provider, MD  Cholecalciferol (VITAMIN D PO) Take by mouth.   Yes Historical Provider, MD  metoprolol (LOPRESSOR) 50 MG tablet Take 50 mg by mouth. 0.5 tablet twice daily   Yes Historical Provider, MD  potassium chloride (K-DUR,KLOR-CON) 10 MEQ tablet Take 10 mEq by mouth 2 (two) times daily.   Yes Historical Provider, MD  riluzole (RILUTEK) 50 MG tablet Take 1 tablet (50 mg total) by mouth every 12 (twelve) hours. 01/14/15  Yes Levert Feinstein, MD     Family History Family History  Problem Relation Age of Onset  . Hypertension Mother     Social History Social History  Substance Use Topics  . Smoking status: Former Games developer  . Smokeless tobacco: Not on file  . Alcohol use No     Comment: Rare     Allergies   Codeine   Review of Systems Review of Systems  All other systems reviewed and are negative.    Physical Exam Triage Vital Signs ED Triage Vitals  Enc Vitals Group     BP 08/30/16 1407 134/66     Pulse Rate 08/30/16 1407 (!) 124     Resp 08/30/16 1407 18     Temp 08/30/16 1407 97.8 F (36.6 C)     Temp Source 08/30/16 1407 Oral     SpO2 08/30/16 1407 93 %     Weight --      Height --      Pain Score 08/30/16 1412 0     Pain Loc --    Updated Vital Signs BP 134/66 (BP Location: Left Arm)   Pulse (!) 124   Temp 97.8 F (36.6 C) (Oral)   Resp 18  SpO2 93%  Physical Exam  Constitutional: She is oriented to person, place, and time.  Alert, nicely groomed  HENT:  Head: Atraumatic.  Moderately dull TMs bilaterally, no erythema Mild nasal congestion Throat is a little bit red  Eyes:  Conjugate gaze, no eye redness/drainage  Neck: Neck supple.  Cardiovascular:  Irregular rhythm heart rate 120s  Pulmonary/Chest: She has no wheezes. She has no rales.  Lungs diminished but symmetric breath sounds Looks slightly distressed with increased respiratory effort, mouth breathing  Abdominal: She exhibits no distension.  Musculoskeletal: Normal range of motion.  No leg swelling  Neurological: She is alert and oriented to person, place, and time.  Skin: Skin is warm and dry.  No cyanosis  Nursing note and vitals reviewed.    UC Treatments / Results  Labs (all labs ordered are listed, but only abnormal results are displayed) Labs Reviewed  D-DIMER, QUANTITATIVE (NOT [facility]) - Abnormal; Notable for the following:       Result Value   D-Dimer, Quant 1.15 (*)    All other components within normal  limits    EKG Sinus tach, HR 120s, no acute ST or T wave changes.  RSR' pattern in V1-V2.    Radiology Dg Chest 2 View  Result Date: 08/30/2016 CLINICAL DATA:  Nonproductive cough and shortness of breath. EXAM: CHEST  2 VIEW COMPARISON:  CT scan of the chest dated 01/13/2015 FINDINGS: Heart size and pulmonary vascularity are normal and the lungs are clear. Aortic atherosclerosis. Chronic thoracolumbar scoliosis. IMPRESSION: No active cardiopulmonary disease. Aortic atherosclerosis. Electronically Signed   By: Francene BoyersJames  Maxwell M.D.   On: 08/30/2016 14:58    Procedures Procedures (including critical care time)  Medications Ordered in UC Medications  enoxaparin (LOVENOX) injection 70 mg sq     Final Clinical Impressions(s) / UC Diagnoses   Final diagnoses:  Shortness of breath  Sinus tachycardia  Acute sinusitis with symptoms > 10 days     Chest xray was unremarkable today.  ECG showed sinus tachycardia, heart rate 120s with RSR' pattern in anterior leads. D dimer was elevated, 1.15 (upper limit of normal is 0.5).    Injection of 70 units of lovenox (a heparin) was given at the urgent care.    Reasonable next steps would include imaging of the chest (CT, to look for a blood clot) or ultrasound of the legs (to look for remaining clots).  Imaging of the chest is most accurate the sooner it is done after a presumed blood clot.    Please discuss with your primary care provider first thing in the morning.  Go to the ED if your breathing worsens before then.     Eustace MooreLaura W Sheilyn Boehlke, MD 08/30/16 [phone]

## 2016-08-30 NOTE — Discharge Instructions (Addendum)
Chest xray was unremarkable today.  ECG showed sinus tachycardia, heart rate 120s with RSR' pattern in anterior leads. D dimer was elevated, 1.15 (upper limit of normal is 0.5).  Injection of 70 units of lovenox (a heparin) was given at the urgent care.  Reasonable next steps would include imaging of the chest (CT, to look for a blood clot) or ultrasound of the legs (to look for remaining clots).  Please discuss with your primary care provider first thing in the morning.  Go to the ED if your breathing worsens before then.

## 2016-08-30 NOTE — ED Triage Notes (Signed)
[facility] a complaint of a cough, congestion and shortness of breath x 10 days that has gotten worse last night.

## 2016-08-30 NOTE — ED Notes (Signed)
Medication just arrived

## 2016-09-01 ENCOUNTER — Emergency Department (HOSPITAL_COMMUNITY): Payer: Medicare Other

## 2016-09-01 ENCOUNTER — Inpatient Hospital Stay (HOSPITAL_COMMUNITY)
Admission: EM | Admit: 2016-09-01 | Discharge: 2016-09-06 | DRG: 193 | Disposition: A | Discharge status: Home Health | Payer: Medicare Other | Attending: Internal Medicine | Admitting: Internal Medicine

## 2016-09-01 ENCOUNTER — Encounter (HOSPITAL_COMMUNITY): Payer: Self-pay | Admitting: Adult Health

## 2016-09-01 DIAGNOSIS — G629 Polyneuropathy, unspecified: Secondary | ICD-10-CM | POA: Diagnosis present

## 2016-09-01 DIAGNOSIS — I2510 Atherosclerotic heart disease of native coronary artery without angina pectoris: Secondary | ICD-10-CM | POA: Diagnosis not present

## 2016-09-01 DIAGNOSIS — J189 Pneumonia, unspecified organism: Principal | ICD-10-CM | POA: Diagnosis present

## 2016-09-01 DIAGNOSIS — Z79899 Other long term (current) drug therapy: Secondary | ICD-10-CM | POA: Diagnosis not present

## 2016-09-01 DIAGNOSIS — E785 Hyperlipidemia, unspecified: Secondary | ICD-10-CM | POA: Diagnosis present

## 2016-09-01 DIAGNOSIS — I350 Nonrheumatic aortic (valve) stenosis: Secondary | ICD-10-CM | POA: Diagnosis present

## 2016-09-01 DIAGNOSIS — J9601 Acute respiratory failure with hypoxia: Secondary | ICD-10-CM | POA: Diagnosis present

## 2016-09-01 DIAGNOSIS — Z885 Allergy status to narcotic agent status: Secondary | ICD-10-CM | POA: Diagnosis not present

## 2016-09-01 DIAGNOSIS — R0902 Hypoxemia: Secondary | ICD-10-CM | POA: Diagnosis not present

## 2016-09-01 DIAGNOSIS — R0602 Shortness of breath: Secondary | ICD-10-CM | POA: Diagnosis not present

## 2016-09-01 DIAGNOSIS — I10 Essential (primary) hypertension: Secondary | ICD-10-CM | POA: Diagnosis present

## 2016-09-01 DIAGNOSIS — Z66 Do not resuscitate: Secondary | ICD-10-CM | POA: Diagnosis present

## 2016-09-01 DIAGNOSIS — G1221 Amyotrophic lateral sclerosis: Secondary | ICD-10-CM | POA: Diagnosis present

## 2016-09-01 DIAGNOSIS — I248 Other forms of acute ischemic heart disease: Secondary | ICD-10-CM | POA: Diagnosis present

## 2016-09-01 DIAGNOSIS — Z7982 Long term (current) use of aspirin: Secondary | ICD-10-CM

## 2016-09-01 DIAGNOSIS — Z87891 Personal history of nicotine dependence: Secondary | ICD-10-CM | POA: Diagnosis not present

## 2016-09-01 DIAGNOSIS — Z951 Presence of aortocoronary bypass graft: Secondary | ICD-10-CM | POA: Diagnosis not present

## 2016-09-01 LAB — CBC WITH DIFFERENTIAL/PLATELET
BASOS ABS: 0 10*3/uL (ref 0.0–0.1)
BASOS PCT: 0 %
EOS ABS: 0.1 10*3/uL (ref 0.0–0.7)
Eosinophils Relative: 1 %
HCT: 39.6 % (ref 36.0–46.0)
HEMOGLOBIN: 13.2 g/dL (ref 12.0–15.0)
Lymphocytes Relative: 12 %
Lymphs Abs: 1.2 10*3/uL (ref 0.7–4.0)
MCH: 29.7 pg (ref 26.0–34.0)
MCHC: 33.3 g/dL (ref 30.0–36.0)
MCV: 89.2 fL (ref 78.0–100.0)
MONOS PCT: 9 %
Monocytes Absolute: 1 10*3/uL (ref 0.1–1.0)
NEUTROS PCT: 78 %
Neutro Abs: 8.2 10*3/uL — ABNORMAL HIGH (ref 1.7–7.7)
Platelets: 201 10*3/uL (ref 150–400)
RBC: 4.44 MIL/uL (ref 3.87–5.11)
RDW: 13.7 % (ref 11.5–15.5)
WBC: 10.5 10*3/uL (ref 4.0–10.5)

## 2016-09-01 LAB — COMPREHENSIVE METABOLIC PANEL
ALK PHOS: 64 U/L (ref 38–126)
ALT: 14 U/L (ref 14–54)
ANION GAP: 11 (ref 5–15)
AST: 21 U/L (ref 15–41)
Albumin: 3.7 g/dL (ref 3.5–5.0)
BILIRUBIN TOTAL: 0.6 mg/dL (ref 0.3–1.2)
BUN: 17 mg/dL (ref 6–20)
CALCIUM: 10 mg/dL (ref 8.9–10.3)
CO2: 29 mmol/L (ref 22–32)
CREATININE: 0.76 mg/dL (ref 0.44–1.00)
Chloride: 101 mmol/L (ref 101–111)
Glucose, Bld: 81 mg/dL (ref 65–99)
Potassium: 3.8 mmol/L (ref 3.5–5.1)
SODIUM: 141 mmol/L (ref 135–145)
TOTAL PROTEIN: 7.2 g/dL (ref 6.5–8.1)

## 2016-09-01 LAB — I-STAT CHEM 8, ED
BUN: 24 mg/dL — ABNORMAL HIGH (ref 6–20)
CREATININE: 0.8 mg/dL (ref 0.44–1.00)
Calcium, Ion: 1.21 mmol/L (ref 1.15–1.40)
Chloride: 100 mmol/L — ABNORMAL LOW (ref 101–111)
GLUCOSE: 82 mg/dL (ref 65–99)
HCT: 40 % (ref 36.0–46.0)
HEMOGLOBIN: 13.6 g/dL (ref 12.0–15.0)
Potassium: 4.1 mmol/L (ref 3.5–5.1)
Sodium: 140 mmol/L (ref 135–145)
TCO2: 33 mmol/L (ref 0–100)

## 2016-09-01 LAB — I-STAT TROPONIN, ED: TROPONIN I, POC: 0.02 ng/mL (ref 0.00–0.08)

## 2016-09-01 LAB — BRAIN NATRIURETIC PEPTIDE: B NATRIURETIC PEPTIDE 5: 172.7 pg/mL — AB (ref 0.0–100.0)

## 2016-09-01 MED ORDER — PIPERACILLIN-TAZOBACTAM 3.375 G IVPB
3.3750 g | Freq: Three times a day (TID) | INTRAVENOUS | Status: DC
  Administered 2016-09-01: 3.375 g via INTRAVENOUS
  Filled 2016-09-01: qty 50

## 2016-09-01 MED ORDER — POTASSIUM CHLORIDE CRYS ER 10 MEQ PO TBCR
10.0000 meq | EXTENDED_RELEASE_TABLET | Freq: Two times a day (BID) | ORAL | Status: DC
  Administered 2016-09-02 – 2016-09-06 (×10): 10 meq via ORAL
  Filled 2016-09-01 (×10): qty 1

## 2016-09-01 MED ORDER — IOPAMIDOL (ISOVUE-370) INJECTION 76%
INTRAVENOUS | Status: AC
  Administered 2016-09-01: 100 mL
  Filled 2016-09-01: qty 100

## 2016-09-01 MED ORDER — IPRATROPIUM-ALBUTEROL 0.5-2.5 (3) MG/3ML IN SOLN
3.0000 mL | Freq: Once | RESPIRATORY_TRACT | Status: AC
  Administered 2016-09-01: 3 mL via RESPIRATORY_TRACT
  Filled 2016-09-01: qty 3

## 2016-09-01 MED ORDER — DEXTROSE 5 % IV SOLN
500.0000 mg | INTRAVENOUS | Status: DC
  Administered 2016-09-02 (×2): 500 mg via INTRAVENOUS
  Filled 2016-09-01 (×2): qty 500

## 2016-09-01 MED ORDER — METOPROLOL TARTRATE 25 MG PO TABS
25.0000 mg | ORAL_TABLET | Freq: Every day | ORAL | Status: DC
  Administered 2016-09-02 – 2016-09-06 (×5): 25 mg via ORAL
  Filled 2016-09-01 (×5): qty 1

## 2016-09-01 MED ORDER — ONDANSETRON HCL 4 MG/2ML IJ SOLN
4.0000 mg | Freq: Four times a day (QID) | INTRAMUSCULAR | Status: DC | PRN
  Administered 2016-09-04: 4 mg via INTRAVENOUS
  Filled 2016-09-01: qty 2

## 2016-09-01 MED ORDER — SODIUM CHLORIDE 0.9 % IV SOLN
500.0000 mg | Freq: Two times a day (BID) | INTRAVENOUS | Status: DC
  Filled 2016-09-01: qty 500

## 2016-09-01 MED ORDER — ONDANSETRON HCL 4 MG PO TABS: 4.0000 mg | ORAL_TABLET | Freq: Four times a day (QID) | ORAL | Status: DC | PRN

## 2016-09-01 MED ORDER — ASPIRIN EC 81 MG PO TBEC
81.0000 mg | DELAYED_RELEASE_TABLET | Freq: Every day | ORAL | Status: DC
  Administered 2016-09-02 – 2016-09-06 (×5): 81 mg via ORAL
  Filled 2016-09-01 (×5): qty 1

## 2016-09-01 MED ORDER — ENOXAPARIN SODIUM 40 MG/0.4ML ~~LOC~~ SOLN
40.0000 mg | SUBCUTANEOUS | Status: DC
  Administered 2016-09-02 – 2016-09-03 (×2): 40 mg via SUBCUTANEOUS
  Filled 2016-09-01 (×2): qty 0.4

## 2016-09-01 MED ORDER — ALBUTEROL SULFATE (2.5 MG/3ML) 0.083% IN NEBU: 2.5000 mg | INHALATION_SOLUTION | RESPIRATORY_TRACT | Status: DC | PRN

## 2016-09-01 MED ORDER — RILUZOLE 50 MG PO TABS
50.0000 mg | ORAL_TABLET | Freq: Two times a day (BID) | ORAL | Status: DC
  Administered 2016-09-02 – 2016-09-06 (×10): 50 mg via ORAL
  Filled 2016-09-01 (×11): qty 1

## 2016-09-01 MED ORDER — CEFTRIAXONE SODIUM 1 G IJ SOLR
1.0000 g | INTRAMUSCULAR | Status: DC
  Administered 2016-09-02 – 2016-09-05 (×5): 1 g via INTRAVENOUS
  Filled 2016-09-01 (×6): qty 10

## 2016-09-01 MED ORDER — VANCOMYCIN HCL IN DEXTROSE 1-5 GM/200ML-% IV SOLN
1000.0000 mg | Freq: Once | INTRAVENOUS | Status: AC
  Administered 2016-09-01: 1000 mg via INTRAVENOUS
  Filled 2016-09-01: qty 200

## 2016-09-01 MED ORDER — VITAMIN D 1000 UNITS PO TABS
2000.0000 [IU] | ORAL_TABLET | Freq: Every day | ORAL | Status: DC
  Administered 2016-09-02 – 2016-09-06 (×5): 2000 [IU] via ORAL
  Filled 2016-09-01 (×5): qty 2

## 2016-09-01 MED ORDER — HYDRALAZINE HCL 20 MG/ML IJ SOLN: 10.0000 mg | INTRAMUSCULAR | Status: DC | PRN

## 2016-09-01 MED ORDER — ACETAMINOPHEN 325 MG PO TABS: 650.0000 mg | ORAL_TABLET | Freq: Four times a day (QID) | ORAL | Status: DC | PRN

## 2016-09-01 MED ORDER — ACETAMINOPHEN 650 MG RE SUPP: 650.0000 mg | Freq: Four times a day (QID) | RECTAL | Status: DC | PRN

## 2016-09-01 NOTE — ED Notes (Signed)
NIF -18 1 attempt. NIF -22 second attempt both with good effort. Instructions given to patient prior to use of the pulmonary mechanic device.   Gurney MaxinMaurice Ellison Rieth, BS, RRT, RCP

## 2016-09-01 NOTE — ED Triage Notes (Signed)
Presents with an elevated D-dimer-[facility] and had normal chest x-ray and D-dimer was elevated, -told to F/U with her Doctor, Doctor unable to see her today. [facility] Here to r/o PE. Denies pain. Endorses Cough and intervals of SOB.

## 2016-09-01 NOTE — ED Provider Notes (Signed)
[facility]   CSN: [id number] Arrival date & time: 09/01/16  1718     History   Chief Complaint Chief Complaint  Patient presents with  . Shortness of Breath    HPI Barbara Collins is a 83 y.o. female.  HPI   Patient is a 83 year old female presenting with elevated d-dimer yesterday. Patient went to urgent care for shortness of breath. Seen at urgnt care and found to have an elevated d-dimer, treated with Lovenox. Told to follow up with her primary care physician. Primary was unable to see her today so she was sent here.  She has noted cough and shortness of breath.  Past Medical History:  Diagnosis Date  . ALS (amyotrophic lateral sclerosis) (HCC)   . Aortic stenosis   . CAD (coronary artery disease)   . Hyperlipemia   . Hypertension   . Lumbar spinal stenosis   . Neuropathy (HCC)    Feet  . Right arm weakness     Patient Active Problem List   Diagnosis Date Noted  . Pneumonia 09/01/2016  . Weakness 11/20/2014  . Arm muscle atrophy 11/20/2014  . Muscle weakness of right arm 11/20/2014    Past Surgical History:  Procedure Laterality Date  . CARDIAC SURGERY     2005  . CATARACT EXTRACTION     Bilateral  . CESAREAN SECTION    . CHOLECYSTECTOMY    . HAND SURGERY     Right    OB History    No data available       Home Medications    Prior to Admission medications   Medication Sig Start Date End Date Taking? Authorizing Provider  aspirin EC 81 MG tablet Take 81 mg by mouth daily.   Yes Historical Provider, MD  Cholecalciferol (VITAMIN D3) 2000 units capsule Take 2,000 Units by mouth daily.   Yes Historical Provider, MD  ibuprofen (ADVIL,MOTRIN) 200 MG tablet Take 200 mg by mouth every 6 (six) hours as needed.   Yes Historical Provider, MD  metoprolol (LOPRESSOR) 50 MG tablet Take 25 mg by mouth daily.    Yes Historical Provider, MD  potassium chloride (K-DUR,KLOR-CON) 10 MEQ tablet Take 10 mEq by mouth 2 (two) times daily.   Yes  Historical Provider, MD  riluzole (RILUTEK) 50 MG tablet Take 1 tablet (50 mg total) by mouth every 12 (twelve) hours. 01/14/15  Yes Levert Feinstein, MD    Family History Family History  Problem Relation Age of Onset  . Hypertension Mother     Social History Social History  Substance Use Topics  . Smoking status: Former Games developer  . Smokeless tobacco: Not on file  . Alcohol use No     Comment: Rare     Allergies   Codeine   Review of Systems Review of Systems  Constitutional: Positive for fatigue. Negative for activity change.  Respiratory: Positive for cough and shortness of breath.   Cardiovascular: Positive for chest pain.  Gastrointestinal: Negative for abdominal pain.  All other systems reviewed and are negative.    Physical Exam Updated Vital Signs BP 149/81   Pulse 86   Temp 97.4 F (36.3 C) (Oral)   Resp (!) 31   Ht 5\' 4"  (1.626 m)   Wt 115 lb (52.2 kg)   SpO2 95%   BMI 19.74 kg/m   Physical Exam  Constitutional: She is oriented to person, place, and time. She appears well-developed and well-nourished.  HENT:  Head: Normocephalic and atraumatic.  Eyes: Right  eye exhibits no discharge. Left eye exhibits no discharge.  Cardiovascular: Normal rate, regular rhythm and normal heart sounds.   No murmur heard. Pulmonary/Chest: She has wheezes. She has rales.  tachypnea  Abdominal: Soft. She exhibits no distension. There is no tenderness.  Neurological: She is oriented to person, place, and time.  Skin: Skin is warm and dry. She is not diaphoretic.  Psychiatric: She has a normal mood and affect.  Nursing note and vitals reviewed.    ED Treatments / Results  Labs (all labs ordered are listed, but only abnormal results are displayed) Labs Reviewed  CBC WITH DIFFERENTIAL/PLATELET - Abnormal; Notable for the following:       Result Value   Neutro Abs 8.2 (*)    All other components within normal limits  BRAIN NATRIURETIC PEPTIDE - Abnormal; Notable for the  following:    B Natriuretic Peptide 172.7 (*)    All other components within normal limits  I-STAT CHEM 8, ED - Abnormal; Notable for the following:    Chloride 100 (*)    BUN 24 (*)    All other components within normal limits  COMPREHENSIVE METABOLIC PANEL  INFLUENZA PANEL BY PCR (TYPE A & B, H1N1)  I-STAT TROPOININ, ED    EKG  EKG Interpretation  Date/Time:  Wednesday September 01 2016 17:55:24 EST Ventricular Rate:  84 PR Interval:    QRS Duration: 97 QT Interval:  379 QTC Calculation: 448 R Axis:   11 Text Interpretation:  no evidence of STEMI currently  Confirmed by Kandis MannanMACKUEN, COURTNEY ([id number]) on 09/01/2016 6:17:54 PM       Radiology Ct Angio Chest Pe W And/or Wo Contrast  Result Date: 09/01/2016 CLINICAL DATA:  Shortness of breath and cough EXAM: CT ANGIOGRAPHY CHEST WITH CONTRAST TECHNIQUE: Multidetector CT imaging of the chest was performed using the standard protocol during bolus administration of intravenous contrast. Multiplanar CT image reconstructions and MIPs were obtained to evaluate the vascular anatomy. CONTRAST:  100 mL Isovue 370 nonionic COMPARISON:  Chest CT Jan 13, 2015; chest radiograph August 30, 2016 FINDINGS: Cardiovascular: There is no demonstrable pulmonary embolus. There is no appreciable thoracic aortic aneurysm or dissection. There are foci of calcification throughout the proximal great vessels bilaterally. There is calcification throughout the aorta. There are foci of coronary artery calcification. Pericardium is not appreciably thickened. Mediastinum/Nodes: Thyroid appears normal. There are scattered subcentimeter mediastinal lymph nodes bilaterally. By size criteria, there is no adenopathy in the thoracic region. Lungs/Pleura: On axial slice 47 series 5, there is a 5 mm nodular opacity in the posterior segment of the right upper lobe. There is patchy airspace consolidation in both lower lobes, felt to represent bilateral lower lobe pneumonia. There is slight  upper lobe bronchiectatic change bilaterally. There is no appreciable pleural effusion or pleural thickening. Upper Abdomen: In the visualized upper abdomen, there is atherosclerotic calcification in the aorta. There appears to be mild adrenal hypertrophy in the incompletely visualized left adrenal. Musculoskeletal: Patient is status post median sternotomy. There is degenerative change in the thoracic spine. No blastic or lytic bone lesions are appreciable. There is degenerative change in each shoulder. Review of the MIP images confirms the above findings. IMPRESSION: No demonstrable pulmonary embolus. Patchy infiltrate most consistent with pneumonia in each lower lobe. New 5 mm nodular opacity posterior segment right upper lobe. No follow-up needed if patient is low-risk. Non-contrast chest CT can be considered in 12 months if patient is high-risk. This recommendation follows the consensus statement: Guidelines for  Management of Incidental Pulmonary Nodules Detected on CT Images: From the Fleischner Society 2017; Radiology 2017; 284:228-243. No evident adenopathy by size criteria. Extensive atherosclerotic calcification as well as foci of coronary artery calcification. Followup PA and lateral chest radiographs recommended in 3-4 weeks following trial of antibiotic therapy to ensure resolution and exclude underlying malignancy. Electronically Signed   By: Bretta Bang III M.D.   On: 09/01/2016 19:38    Procedures Procedures (including critical care time)  Medications Ordered in ED Medications  vancomycin (VANCOCIN) IVPB 1000 mg/200 mL premix (1,000 mg Intravenous New Bag/Given 09/01/16 2049)  vancomycin (VANCOCIN) 500 mg in sodium chloride 0.9 % 100 mL IVPB (not administered)  piperacillin-tazobactam (ZOSYN) IVPB 3.375 g (3.375 g Intravenous New Bag/Given 09/01/16 2049)  ipratropium-albuterol (DUONEB) 0.5-2.5 (3) MG/3ML nebulizer solution 3 mL (3 mLs Nebulization Given 09/01/16 1837)  iopamidol  (ISOVUE-370) 76 % injection (100 mLs  Contrast Given 09/01/16 1911)     Initial Impression / Assessment and Plan / ED Course  I have reviewed the triage vital signs and the nursing notes.  Pertinent labs & imaging results that were available during my care of the patient were reviewed by me and considered in my medical decision making (see chart for details).  Clinical Course     Patient is an 83 year old female with ALS presenting with shortness of breath and cough. Patient went to urgent care yesterday with similar symptoms found to have an elevated d-dimer and was sent to follow up with primary care today. Primary care was unavailable and so patient was brought in by her daughter. Patient has noted cough, congestion, and her chest "feels full". Patient unable to cough up a lot of the secretions because of weakness.In addition patient has been having some trouble eating, 60 pound weight loss in the last year. Concern today for aspiration pneumonia versus flu.  We will get labs, CT angiogram and elevated d-dimer. However I'm most concerned for influenza today. We will get nif, duoneb.   CT shows pna, treateing broadly given ALS.    NIFs completed- 18 and 20. Weakness from ALS vs deconditioning. Watch closely for respiratory montioring.   Final Clinical Impressions(s) / ED Diagnoses   Final diagnoses:  Hypoxia    New Prescriptions New Prescriptions   No medications on file     Violet Cart Randall An, MD 09/01/16 2055

## 2016-09-01 NOTE — H&P (Signed)
History and Physical    Barbara Collins ZOX:[id number] DOB: 1932/11/24 DOA: 09/01/2016  PCP: Barbara Saupe, MD  Patient coming from: Home.  Chief Complaint: Chest tightness cough and shortness of breath.  HPI: Barbara Collins is a 83 y.o. female with history of ALS diagnosed around a year and a half ago on Riluzole, CAD status post CABG, hypertension presents to the ER because of worsening chest tightness with shortness of breath and cough. Patient states her symptoms started a week ago and had gone to urgent care center. Since symptoms have been persistent patient came to the ER. CT angiogram of the chest shows bilateral lower lobe pneumonia. Patient is not in respiratory distress at this time. Her chest tightness is persistent and has no relation to exertion or any position. Patient's blood pressures also found to be elevated. Patient is being admitted for acute respiratory failure with pneumonia.   ED Course: CT angiogram of the chest is showing bilateral lower lobe pneumonia.  Review of Systems: As per HPI, rest all negative.   Past Medical History:  Diagnosis Date  . ALS (amyotrophic lateral sclerosis) (HCC)   . Aortic stenosis   . CAD (coronary artery disease)   . Hyperlipemia   . Hypertension   . Lumbar spinal stenosis   . Neuropathy (HCC)    Feet  . Right arm weakness     Past Surgical History:  Procedure Laterality Date  . CARDIAC SURGERY     2005  . CATARACT EXTRACTION     Bilateral  . CESAREAN SECTION    . CHOLECYSTECTOMY    . HAND SURGERY     Right     reports that she has quit smoking. She has never used smokeless tobacco. She reports that she does not drink alcohol or use drugs.  Allergies  Allergen Reactions  . Codeine Shortness Of Breath and Nausea Only    Family History  Problem Relation Age of Onset  . Hypertension Mother     Prior to Admission medications   Medication Sig Start Date End Date Taking? Authorizing Provider  aspirin EC 81 MG tablet Take  81 mg by mouth daily.   Yes Historical Provider, MD  Cholecalciferol (VITAMIN D3) 2000 units capsule Take 2,000 Units by mouth daily.   Yes Historical Provider, MD  ibuprofen (ADVIL,MOTRIN) 200 MG tablet Take 200 mg by mouth every 6 (six) hours as needed.   Yes Historical Provider, MD  metoprolol (LOPRESSOR) 50 MG tablet Take 25 mg by mouth daily.    Yes Historical Provider, MD  potassium chloride (K-DUR,KLOR-CON) 10 MEQ tablet Take 10 mEq by mouth 2 (two) times daily.   Yes Historical Provider, MD  riluzole (RILUTEK) 50 MG tablet Take 1 tablet (50 mg total) by mouth every 12 (twelve) hours. 01/14/15  Yes Levert Feinstein, MD    Physical Exam: Vitals:   09/01/16 1733 09/01/16 1734 09/01/16 1815 09/01/16 2015  BP: 123/66  112/94 149/81  Pulse: 86  83 86  Resp: 20  24 (!) 31  Temp: 97.4 F (36.3 C)     TempSrc: Oral     SpO2: 96%  96% 95%  Weight:  52.2 kg (115 lb)    Height:  5\' 4"  (1.626 m)        Constitutional: Moderately built and nourished. Vitals:   09/01/16 1733 09/01/16 1734 09/01/16 1815 09/01/16 2015  BP: 123/66  112/94 149/81  Pulse: 86  83 86  Resp: 20  24 (!) 31  Temp: 97.4  F (36.3 C)     TempSrc: Oral     SpO2: 96%  96% 95%  Weight:  52.2 kg (115 lb)    Height:  5\' 4"  (1.626 m)     Eyes: Anicteric. No pallor. ENMT: No discharge from ears eyes nose and mouth. Neck: No mass felt. No JVD appreciated. Respiratory: Bilateral coarse breath sounds. No crepitations no rhonchi. Cardiovascular: S1 and S2 heard. No murmurs appreciated. Abdomen: Soft nontender bowel sounds present. Musculoskeletal: No edema. No joint effusion. Skin: No rash. Skin appears warm. Neurologic: Alert awake oriented to time place and person. Moves all extremities. Psychiatric: Appears normal. Normal affect.   Labs on Admission: I have personally reviewed following labs and imaging studies  CBC:  Recent Labs Lab 09/01/16 1740 09/01/16 1817  WBC 10.5  --   NEUTROABS 8.2*  --   HGB 13.2  13.6  HCT 39.6 40.0  MCV 89.2  --   PLT 201  --    Basic Metabolic Panel:  Recent Labs Lab 09/01/16 1740 09/01/16 1817  NA 141 140  K 3.8 4.1  CL 101 100*  CO2 29  --   GLUCOSE 81 82  BUN 17 24*  CREATININE 0.76 0.80  CALCIUM 10.0  --    GFR: Estimated Creatinine Clearance: 45.4 mL/min (by C-G formula based on SCr of 0.8 mg/dL). Liver Function Tests:  Recent Labs Lab 09/01/16 1740  AST 21  ALT 14  ALKPHOS 64  BILITOT 0.6  PROT 7.2  ALBUMIN 3.7   No results for input(s): LIPASE, AMYLASE in the last 168 hours. No results for input(s): AMMONIA in the last 168 hours. Coagulation Profile: No results for input(s): INR, PROTIME in the last 168 hours. Cardiac Enzymes: No results for input(s): CKTOTAL, CKMB, CKMBINDEX, TROPONINI in the last 168 hours. BNP (last 3 results) No results for input(s): PROBNP in the last 8760 hours. HbA1C: No results for input(s): HGBA1C in the last 72 hours. CBG: No results for input(s): GLUCAP in the last 168 hours. Lipid Profile: No results for input(s): CHOL, HDL, LDLCALC, TRIG, CHOLHDL, LDLDIRECT in the last 72 hours. Thyroid Function Tests: No results for input(s): TSH, T4TOTAL, FREET4, T3FREE, THYROIDAB in the last 72 hours. Anemia Panel: No results for input(s): VITAMINB12, FOLATE, FERRITIN, TIBC, IRON, RETICCTPCT in the last 72 hours. Urine analysis: No results found for: COLORURINE, APPEARANCEUR, LABSPEC, PHURINE, GLUCOSEU, HGBUR, BILIRUBINUR, KETONESUR, PROTEINUR, UROBILINOGEN, NITRITE, LEUKOCYTESUR Sepsis Labs: @LABRCNTIP (procalcitonin:4,lacticidven:4) )No results found for this or any previous visit (from the past 240 hour(s)).   Radiological Exams on Admission: Ct Angio Chest Pe W And/or Wo Contrast  Result Date: 09/01/2016 CLINICAL DATA:  Shortness of breath and cough EXAM: CT ANGIOGRAPHY CHEST WITH CONTRAST TECHNIQUE: Multidetector CT imaging of the chest was performed using the standard protocol during bolus  administration of intravenous contrast. Multiplanar CT image reconstructions and MIPs were obtained to evaluate the vascular anatomy. CONTRAST:  100 mL Isovue 370 nonionic COMPARISON:  Chest CT Jan 13, 2015; chest radiograph August 30, 2016 FINDINGS: Cardiovascular: There is no demonstrable pulmonary embolus. There is no appreciable thoracic aortic aneurysm or dissection. There are foci of calcification throughout the proximal great vessels bilaterally. There is calcification throughout the aorta. There are foci of coronary artery calcification. Pericardium is not appreciably thickened. Mediastinum/Nodes: Thyroid appears normal. There are scattered subcentimeter mediastinal lymph nodes bilaterally. By size criteria, there is no adenopathy in the thoracic region. Lungs/Pleura: On axial slice 47 series 5, there is a 5 mm nodular opacity  in the posterior segment of the right upper lobe. There is patchy airspace consolidation in both lower lobes, felt to represent bilateral lower lobe pneumonia. There is slight upper lobe bronchiectatic change bilaterally. There is no appreciable pleural effusion or pleural thickening. Upper Abdomen: In the visualized upper abdomen, there is atherosclerotic calcification in the aorta. There appears to be mild adrenal hypertrophy in the incompletely visualized left adrenal. Musculoskeletal: Patient is status post median sternotomy. There is degenerative change in the thoracic spine. No blastic or lytic bone lesions are appreciable. There is degenerative change in each shoulder. Review of the MIP images confirms the above findings. IMPRESSION: No demonstrable pulmonary embolus. Patchy infiltrate most consistent with pneumonia in each lower lobe. New 5 mm nodular opacity posterior segment right upper lobe. No follow-up needed if patient is low-risk. Non-contrast chest CT can be considered in 12 months if patient is high-risk. This recommendation follows the consensus statement: Guidelines  for Management of Incidental Pulmonary Nodules Detected on CT Images: From the Fleischner Society 2017; Radiology 2017; 284:228-243. No evident adenopathy by size criteria. Extensive atherosclerotic calcification as well as foci of coronary artery calcification. Followup PA and lateral chest radiographs recommended in 3-4 weeks following trial of antibiotic therapy to ensure resolution and exclude underlying malignancy. Electronically Signed   By: Bretta BangWilliam  Woodruff III M.D.   On: 09/01/2016 19:38    EKG: Independently reviewed. Normal sinus rhythm with nonspecific ST-T changes.  Assessment/Plan Principal Problem:   Acute respiratory failure with hypoxia (HCC) Active Problems:   CAP (community acquired pneumonia)   ALS (amyotrophic lateral sclerosis) (HCC)   CAD (coronary artery disease) s/p CABG.   Hypertension    1. Acute respiratory failure with hypoxia secondary to pneumonia - patient has been placed on ceftriaxone and Zithromax for community acquired pneumonia. Check influenza PCR. Check urine for Legionella and strep antigen. Follow sputum cultures and blood cultures. Since patient has ALS we will closely monitor respiratory status and NIF. Patient is a DO NOT RESUSCITATE. Due to chest tightness will keep patient on when necessary nebulizer. 2. CAD status post CABG - since patient is complaining of chest tightness, probably from pneumonia but given history of CAD will cycle cardiac markers. 3. Hypertension uncontrolled - patient's blood pressure has been remaining high in the ER. Continue metoprolol. I have added when necessary IV hydralazine for systolic blood pressure more than 180. 4. ALS - on Riluzole [facility].   DVT prophylaxis: Lovenox. Code Status: DO NOT RESUSCITATE.  Family Communication: Patient's daughter.  Disposition Plan: Home.  Consults called: None.  Admission status: Inpatient.    Eduard ClosKAKRAKANDY,ARSHAD N. MD [facility] Pager [phone].  If  7PM-7AM, please contact night-coverage [email or web address] Password [email or web address]  09/01/2016, 10:25 PM

## 2016-09-01 NOTE — Progress Notes (Signed)
Pharmacy Antibiotic Note  Barbara Collins is a 83 y.o. female admitted on 09/01/2016 with pneumonia.  Pharmacy has been consulted for vancomycin and Zosyn dosing.  Plan: Vancomycin 1g IV x1, then 500mg  IV every 12 hours.  Goal trough 15-20 mcg/mL. Zosyn 3.375g IV q8h (4 hour infusion). Follow c/s, clinical progression, renal function, trough as needed  Height: 5\' 4"  (162.6 cm) Weight: 115 lb (52.2 kg) IBW/kg (Calculated) : 54.7  Temp (24hrs), Avg:97.4 F (36.3 C), Min:97.4 F (36.3 C), Max:97.4 F (36.3 C)   Recent Labs Lab 09/01/16 1740 09/01/16 1817  WBC 10.5  --   CREATININE 0.76 0.80    Estimated Creatinine Clearance: 45.4 mL/min (by C-G formula based on SCr of 0.8 mg/dL).    Allergies  Allergen Reactions  . Codeine Shortness Of Breath and Nausea Only    Antimicrobials this admission: Vancomycin 1/3 >>  Zosyn 1/3 >>   Dose adjustments this admission: n/a  Microbiology results: None obtained yet  Thank you for allowing pharmacy to be a part of this patient's care.  Barbara Collins, PharmD, BCPS Clinical Pharmacist Pager: [phone] 09/01/2016 8:28 PM

## 2016-09-01 NOTE — ED Notes (Signed)
SpO2 87% on RA, placed on 2L [place]. SpO2 increased to 92-94%

## 2016-09-02 DIAGNOSIS — G1221 Amyotrophic lateral sclerosis: Secondary | ICD-10-CM

## 2016-09-02 DIAGNOSIS — I2510 Atherosclerotic heart disease of native coronary artery without angina pectoris: Secondary | ICD-10-CM

## 2016-09-02 LAB — CBC WITH DIFFERENTIAL/PLATELET
Basophils Absolute: 0 10*3/uL (ref 0.0–0.1)
Basophils Relative: 0 %
EOS ABS: 0.1 10*3/uL (ref 0.0–0.7)
Eosinophils Relative: 2 %
HEMATOCRIT: 34.8 % — AB (ref 36.0–46.0)
Hemoglobin: 11.4 g/dL — ABNORMAL LOW (ref 12.0–15.0)
LYMPHS ABS: 1.4 10*3/uL (ref 0.7–4.0)
LYMPHS PCT: 19 %
MCH: 29.3 pg (ref 26.0–34.0)
MCHC: 32.8 g/dL (ref 30.0–36.0)
MCV: 89.5 fL (ref 78.0–100.0)
MONOS PCT: 9 %
Monocytes Absolute: 0.7 10*3/uL (ref 0.1–1.0)
NEUTROS ABS: 5.2 10*3/uL (ref 1.7–7.7)
NEUTROS PCT: 70 %
Platelets: 156 10*3/uL (ref 150–400)
RBC: 3.89 MIL/uL (ref 3.87–5.11)
RDW: 14 % (ref 11.5–15.5)
WBC: 7.4 10*3/uL (ref 4.0–10.5)

## 2016-09-02 LAB — CREATININE, SERUM: Creatinine, Ser: 0.64 mg/dL (ref 0.44–1.00)

## 2016-09-02 LAB — CBC
HEMATOCRIT: 40.1 % (ref 36.0–46.0)
Hemoglobin: 12.9 g/dL (ref 12.0–15.0)
MCH: 28.9 pg (ref 26.0–34.0)
MCHC: 32.2 g/dL (ref 30.0–36.0)
MCV: 89.7 fL (ref 78.0–100.0)
PLATELETS: 173 10*3/uL (ref 150–400)
RBC: 4.47 MIL/uL (ref 3.87–5.11)
RDW: 14 % (ref 11.5–15.5)
WBC: 8.4 10*3/uL (ref 4.0–10.5)

## 2016-09-02 LAB — COMPREHENSIVE METABOLIC PANEL
ALT: 13 U/L — AB (ref 14–54)
AST: 19 U/L (ref 15–41)
Albumin: 2.9 g/dL — ABNORMAL LOW (ref 3.5–5.0)
Alkaline Phosphatase: 53 U/L (ref 38–126)
Anion gap: 7 (ref 5–15)
BUN: 13 mg/dL (ref 6–20)
CHLORIDE: 99 mmol/L — AB (ref 101–111)
CO2: 31 mmol/L (ref 22–32)
CREATININE: 0.59 mg/dL (ref 0.44–1.00)
Calcium: 9.2 mg/dL (ref 8.9–10.3)
GFR calc non Af Amer: >60 mL/min (ref >60)
Glucose, Bld: 93 mg/dL (ref 65–99)
POTASSIUM: 3.6 mmol/L (ref 3.5–5.1)
SODIUM: 137 mmol/L (ref 135–145)
Total Bilirubin: 0.5 mg/dL (ref 0.3–1.2)
Total Protein: 6 g/dL — ABNORMAL LOW (ref 6.5–8.1)

## 2016-09-02 LAB — TROPONIN I
TROPONIN I: 0.04 ng/mL — AB (ref <0.03)
Troponin I: 0.03 ng/mL (ref <0.03)
Troponin I: 0.04 ng/mL (ref <0.03)

## 2016-09-02 LAB — INFLUENZA PANEL BY PCR (TYPE A & B)
Influenza A By PCR: NEGATIVE
Influenza B By PCR: NEGATIVE

## 2016-09-02 LAB — STREP PNEUMONIAE URINARY ANTIGEN: Strep Pneumo Urinary Antigen: NEGATIVE

## 2016-09-02 MED ORDER — DIPHENHYDRAMINE HCL 25 MG PO CAPS
25.0000 mg | ORAL_CAPSULE | Freq: Every evening | ORAL | Status: DC | PRN
  Administered 2016-09-02 – 2016-09-05 (×4): 25 mg via ORAL
  Filled 2016-09-02 (×4): qty 1

## 2016-09-02 NOTE — Progress Notes (Signed)
NIF -16. RT took the best of 3. All 3 times pt gave good effort. Instructions were given each time.

## 2016-09-02 NOTE — Progress Notes (Signed)
Pt. Performed -17 on the NIF. Pt. Performed 3 times and each time pt. Got -17.

## 2016-09-02 NOTE — Progress Notes (Addendum)
Patient ID: Barbara PateeHazel Rhem, female   DOB: Jul 16, 1933, 83 y.o.   MRN: [id number]  PROGRESS NOTE    Barbara Collins  [id number] DOB: Jul 16, 1933 DOA: 09/01/2016  PCP: Cain SaupeFULP, CAMMIE, MD   Brief Narrative:  83 y.o. female with history of ALS diagnosed around a year and a half ago, on Riluzole, CAD status post CABG, hypertension who [facility] with worsening chest tightness and shortness of breath and cough for past 1 week prior to this admission.  CT angiogram of the chest shows bilateral lower lobe pneumonia. She was started on azithromycin and rocephin  Assessment & Plan:   Principal Problem:   Acute respiratory failure with hypoxia (HCC) / Bilateral lower lobe pneumonia  - CT angiogram of the chest shows bilateral lower lobe pneumonia - Started on empiric abx: azithromycin and rocephin - Follow up blood cx results - Strep pneumonia, influenza is negative - Legionella is pending   Active Problems:   Mild troponin elevation - Likely demand ischemia from hypoxia and pneumonia - No chest pain - Trop stable at 0.04, 0.04, 0.03    ALS (amyotrophic lateral sclerosis) (HCC) - Continue riluzole     CAD (coronary artery disease) s/p CABG - Continue aspirin daily    Essential hypertension - Continue metoprolol    DVT prophylaxis: Lovenox subQ Code Status: DNR/DNI Family Communication: no family at the bedside this am Disposition Plan: home likely by 09/04/2016   Consultants:   None   Procedures:   None   Antimicrobials:   Azithromycin and rocephin 09/01/2016 -->    Subjective: No overnight events.  Objective: Vitals:   09/02/16 0500 09/02/16 1026 09/02/16 1309 09/02/16 1440  BP: (!) 152/69 (!) 149/80  124/64  Pulse: 90 84 86 76  Resp:    19  Temp: 98 F (36.7 C)   97.7 F (36.5 C)  TempSrc: Oral   Oral  SpO2: 94%  97% 97%  Weight:      Height:        Intake/Output Summary (Last 24 hours) at 09/02/16 1645 Last data filed at 09/02/16 1230  Gross per 24 hour   Intake              680 ml  Output              100 ml  Net              580 ml   Filed Weights   09/01/16 1734  Weight: 52.2 kg (115 lb)    Examination:  General exam: Appears calm and comfortable  Respiratory system: Diminished breath sounds bilaterally, no wheezing Cardiovascular system: S1 & S2 heard, Rate controlled, SEM +3/6 appreciated  Gastrointestinal system: Abdomen is nondistended, soft and nontender. No organomegaly or masses felt. Normal bowel sounds heard. Central nervous system: Alert and oriented. No focal neurological deficits. Extremities: Symmetric 5 x 5 power. Skin: No rashes, lesions or ulcers Psychiatry: Judgement and insight appear normal. Mood & affect appropriate.   Data Reviewed: I have personally reviewed following labs and imaging studies  CBC:  Recent Labs Lab 09/01/16 1740 09/01/16 1817 09/01/16 2356 09/02/16 0343  WBC 10.5  --  8.4 7.4  NEUTROABS 8.2*  --   --  5.2  HGB 13.2 13.6 12.9 11.4*  HCT 39.6 40.0 40.1 34.8*  MCV 89.2  --  89.7 89.5  PLT 201  --  173 156   Basic Metabolic Panel:  Recent Labs Lab 09/01/16 1740 09/01/16 1817 09/01/16 2356  09/02/16 0343  NA 141 140  --  137  K 3.8 4.1  --  3.6  CL 101 100*  --  99*  CO2 29  --   --  31  GLUCOSE 81 82  --  93  BUN 17 24*  --  13  CREATININE 0.76 0.80 0.64 0.59  CALCIUM 10.0  --   --  9.2   GFR: Estimated Creatinine Clearance: 45.4 mL/min (by C-G formula based on SCr of 0.59 mg/dL). Liver Function Tests:  Recent Labs Lab 09/01/16 1740 09/02/16 0343  AST 21 19  ALT 14 13*  ALKPHOS 64 53  BILITOT 0.6 0.5  PROT 7.2 6.0*  ALBUMIN 3.7 2.9*   No results for input(s): LIPASE, AMYLASE in the last 168 hours. No results for input(s): AMMONIA in the last 168 hours. Coagulation Profile: No results for input(s): INR, PROTIME in the last 168 hours. Cardiac Enzymes:  Recent Labs Lab 09/01/16 2356 09/02/16 0343 09/02/16 1049  TROPONINI 0.04* 0.04* 0.03*   BNP (last  3 results) No results for input(s): PROBNP in the last 8760 hours. HbA1C: No results for input(s): HGBA1C in the last 72 hours. CBG: No results for input(s): GLUCAP in the last 168 hours. Lipid Profile: No results for input(s): CHOL, HDL, LDLCALC, TRIG, CHOLHDL, LDLDIRECT in the last 72 hours. Thyroid Function Tests: No results for input(s): TSH, T4TOTAL, FREET4, T3FREE, THYROIDAB in the last 72 hours. Anemia Panel: No results for input(s): VITAMINB12, FOLATE, FERRITIN, TIBC, IRON, RETICCTPCT in the last 72 hours. Urine analysis: No results found for: COLORURINE, APPEARANCEUR, LABSPEC, PHURINE, GLUCOSEU, HGBUR, BILIRUBINUR, KETONESUR, PROTEINUR, UROBILINOGEN, NITRITE, LEUKOCYTESUR Sepsis Labs: @LABRCNTIP (procalcitonin:4,lacticidven:4)   )No results found for this or any previous visit (from the past 240 hour(s)).    Radiology Studies: Dg Chest 2 View Result Date: 08/30/2016 No active cardiopulmonary disease. Aortic atherosclerosis. Electronically Signed   By: Francene Boyers M.D.   On: 08/30/2016 14:58   Ct Angio Chest Pe W And/or Wo Contrast Result Date: 09/01/2016 No demonstrable pulmonary embolus. Patchy infiltrate most consistent with pneumonia in each lower lobe. New 5 mm nodular opacity posterior segment right upper lobe. No follow-up needed if patient is low-risk. Non-contrast chest CT can be considered in 12 months if patient is high-risk. This recommendation follows the consensus statement: Guidelines for Management of Incidental Pulmonary Nodules Detected on CT Images: From the Fleischner Society 2017; Radiology 2017; 284:228-243. No evident adenopathy by size criteria. Extensive atherosclerotic calcification as well as foci of coronary artery calcification. Followup PA and lateral chest radiographs recommended in 3-4 weeks following trial of antibiotic therapy to ensure resolution and exclude underlying malignancy. Electronically Signed   By: Bretta Bang III M.D.   On:  09/01/2016 19:38      Scheduled Meds: . aspirin EC  81 mg Oral Daily  . azithromycin  500 mg Intravenous Q24H  . cefTRIAXone (ROCEPHIN)  IV  1 g Intravenous Q24H  . cholecalciferol  2,000 Units Oral Daily  . enoxaparin (LOVENOX) injection  40 mg Subcutaneous Q24H  . metoprolol  25 mg Oral Daily  . potassium chloride  10 mEq Oral BID  . riluzole  50 mg Oral Q12H   Continuous Infusions:   LOS: 1 day    Time spent: 25 minutes  Greater than 50% of the time spent on counseling and coordinating the care.   Manson Passey, MD [facility] Pager [phone]  If 7PM-7AM, please contact night-coverage [email or web address] Password [email or web address] 09/02/2016, 4:45 PM

## 2016-09-03 LAB — LEGIONELLA PNEUMOPHILA SEROGP 1 UR AG: L. PNEUMOPHILA SEROGP 1 UR AG: NEGATIVE

## 2016-09-03 MED ORDER — LEVALBUTEROL HCL 1.25 MG/0.5ML IN NEBU
1.2500 mg | INHALATION_SOLUTION | Freq: Four times a day (QID) | RESPIRATORY_TRACT | Status: DC | PRN
  Administered 2016-09-05: 1.25 mg via RESPIRATORY_TRACT
  Filled 2016-09-03: qty 0.5

## 2016-09-03 MED ORDER — AZITHROMYCIN 500 MG PO TABS
500.0000 mg | ORAL_TABLET | Freq: Every day | ORAL | Status: DC
  Administered 2016-09-03 – 2016-09-05 (×3): 500 mg via ORAL
  Filled 2016-09-03 (×4): qty 1

## 2016-09-03 MED ORDER — LEVALBUTEROL HCL 1.25 MG/0.5ML IN NEBU
1.2500 mg | INHALATION_SOLUTION | RESPIRATORY_TRACT | Status: DC
Start: 2016-09-03 — End: 2016-09-03
  Administered 2016-09-03 (×2): 1.25 mg via RESPIRATORY_TRACT
  Filled 2016-09-03 (×2): qty 0.5

## 2016-09-03 MED ORDER — IPRATROPIUM BROMIDE 0.02 % IN SOLN
0.5000 mg | RESPIRATORY_TRACT | Status: DC
  Administered 2016-09-03 (×2): 0.5 mg via RESPIRATORY_TRACT
  Filled 2016-09-03 (×2): qty 2.5

## 2016-09-03 MED ORDER — IPRATROPIUM BROMIDE 0.02 % IN SOLN: 0.5000 mg | Freq: Four times a day (QID) | RESPIRATORY_TRACT | Status: DC

## 2016-09-03 MED ORDER — GUAIFENESIN 100 MG/5ML PO SOLN
5.0000 mL | ORAL | Status: DC | PRN
  Administered 2016-09-03: 100 mg via ORAL
  Filled 2016-09-03: qty 5

## 2016-09-03 NOTE — Progress Notes (Signed)
RT NOTE:   NIF -18, good effort.

## 2016-09-03 NOTE — Care Management Note (Signed)
Case Management Note Donn PieriniKristi Trenice Mesa RN, BSN Unit 2W-Case Manager [phone]  Patient Details  Name: Alferd PateeHazel Collins MRN: [id number] Date of Birth: October 16, 1932  Subjective/Objective:  Pt admitted with PNA-acute resp. Failure with hypoxia                  Action/Plan: PTA pt lived at home with daughter- per conversation with pt at bedside- pt lives with daughter and 7 yr. Old granddaughter- daughter works here per pt at night and has a friend that stays with pt and granddaughter while daughter works. Per pt she has a cane, walker and w/c at home and no other DME at this time. Currently on 02-2L- may need to assess for home 02 needs prior to discharge if unable to wean off 02-  Pt also states that she [facility], has a PCP here in town that recently is no longer with practice-Dr. Jillyn HiddenFulp- and pt is seeing a new doctor within the practice but does not remember the name.  PT eval pending- anticipate return home with daughter- CM will follow for potential needs.   Expected Discharge Date:              Expected Discharge Plan:  Home w Home Health Services  In-House Referral:     Discharge planning Services  CM Consult  Post Acute Care Choice:    Choice offered to:     DME Arranged:    DME Agency:     HH Arranged:    HH Agency:     Status of Service:  In process, will continue to follow  If discussed at Long Length of Stay Meetings, dates discussed:    Additional Comments:  Darrold SpanWebster, Shanta Dorvil Hall, RN 09/03/2016, 3:26 PM

## 2016-09-03 NOTE — Progress Notes (Signed)
PHARMACIST - PHYSICIAN COMMUNICATION  CONCERNING: Antibiotic IV to Oral Route Change Policy  RECOMMENDATION: This patient is receiving azithromycin by the intravenous route.  Based on criteria approved by the Pharmacy and Therapeutics Committee, the antibiotic(s) is/are being converted to the equivalent oral dose form(s).   DESCRIPTION: These criteria include:  Patient being treated for a respiratory tract infection, urinary tract infection, cellulitis or clostridium difficile associated diarrhea if on metronidazole  The patient is not neutropenic and does not exhibit a GI malabsorption state  The patient is eating (either orally or via tube) and/or has been taking other orally administered medications for a least 24 hours  The patient is improving clinically and has a Tmax < 100.5  If you have questions about this conversion, please contact the Pharmacy Department  []   [phone]( [phone] )  [facility] []   [phone]( [phone] )  [facility] [x]   [phone]( [phone] )  [facility] []   [phone]( [phone] )  [facility] []   [phone]( [phone] )  [facility]   Harland Germanndrew Kadence Mikkelson, [place] 09/03/2016 8:11 AM

## 2016-09-03 NOTE — Progress Notes (Signed)
Patient ID: Barbara Collins, female   DOB: 04/11/1933, 82 y.o.   MRN: [id number]  PROGRESS NOTE    Barbara Collins  WUJ:[id number] DOB: Mar 26, 1933 DOA: 09/01/2016  PCP: Cain Saupe, MD   Brief Narrative:  83 y.o. female with history of ALS diagnosed around a year and a half ago, on Riluzole, CAD status post CABG, hypertension who presented to [facility] ED with worsening chest tightness and shortness of breath and cough for past 1 week prior to this admission.  CT angiogram of the chest shows bilateral lower lobe pneumonia. She was started on azithromycin and rocephin  Assessment & Plan:   Principal Problem:   Acute respiratory failure with hypoxia (HCC) / Bilateral lower lobe pneumonia  - CT angiogram of the chest on admission showed bilateral lower lobe pneumonia - Continue azithromycin and rocephin - Blood cx not collected on admission  - Strep pneumonia, influenza is negative - Legionella is pending  - Sputum cx is pending   Active Problems:   Mild troponin elevation - Likely demand ischemia from hypoxia and pneumonia - No chest pain - Trop stable at 0.04, 0.04, 0.03    ALS (amyotrophic lateral sclerosis) (HCC) - Continue riluzole     CAD (coronary artery disease) s/p CABG - Continue aspirin daily    Essential hypertension - Continue metoprolol    DVT prophylaxis: Lovenox subQ Code Status: DNR/DNI Family Communication: no family at the bedside this am Disposition Plan: home likely by 09/06/2016   Consultants:   None   Procedures:   None   Antimicrobials:   Azithromycin and rocephin 09/01/2016 -->    Subjective: No overnight events. Feels very congested and short of breath with exertion.   Objective: Vitals:   09/02/16 1440 09/02/16 2036 09/03/16 0655 09/03/16 0728  BP: 124/64 127/66 (!) 124/54   Pulse: 76 100 85   Resp: 19 20 18    Temp: 97.7 F (36.5 C) 98 F (36.7 C) 97.5 F (36.4 C)   TempSrc: Oral Oral Oral   SpO2: 97% 96% 95% 97%  Weight:   50.2 kg (110 lb  9.6 oz)   Height:        Intake/Output Summary (Last 24 hours) at 09/03/16 1136 Last data filed at 09/03/16 0800  Gross per 24 hour  Intake              360 ml  Output              410 ml  Net              -50 ml   Filed Weights   09/01/16 1734 09/03/16 0655  Weight: 52.2 kg (115 lb) 50.2 kg (110 lb 9.6 oz)    Examination:  General exam: Appears calm and comfortable, no distress  Respiratory system: coarse sounds bilaterally, wheezing in upper lung lobes  Cardiovascular system: S1 & S2 heard, Rate controlled, SEM +3/6 appreciated  Gastrointestinal system: (+) BS, non tender  Central nervous system: No focal neurological deficits. Extremities: LE edema trace, palpable pulses  Skin: No rashes, lesions or ulcers, skin is warm and dry Psychiatry: Normal mood and behavior   Data Reviewed: I have personally reviewed following labs and imaging studies  CBC:  Recent Labs Lab 09/01/16 1740 09/01/16 1817 09/01/16 2356 09/02/16 0343  WBC 10.5  --  8.4 7.4  NEUTROABS 8.2*  --   --  5.2  HGB 13.2 13.6 12.9 11.4*  HCT 39.6 40.0 40.1 34.8*  MCV 89.2  --  89.7 89.5  PLT 201  --  173 156   Basic Metabolic Panel:  Recent Labs Lab 09/01/16 1740 09/01/16 1817 09/01/16 2356 09/02/16 0343  NA 141 140  --  137  K 3.8 4.1  --  3.6  CL 101 100*  --  99*  CO2 29  --   --  31  GLUCOSE 81 82  --  93  BUN 17 24*  --  13  CREATININE 0.76 0.80 0.64 0.59  CALCIUM 10.0  --   --  9.2   GFR: Estimated Creatinine Clearance: 43.7 mL/min (by C-G formula based on SCr of 0.59 mg/dL). Liver Function Tests:  Recent Labs Lab 09/01/16 1740 09/02/16 0343  AST 21 19  ALT 14 13*  ALKPHOS 64 53  BILITOT 0.6 0.5  PROT 7.2 6.0*  ALBUMIN 3.7 2.9*   No results for input(s): LIPASE, AMYLASE in the last 168 hours. No results for input(s): AMMONIA in the last 168 hours. Coagulation Profile: No results for input(s): INR, PROTIME in the last 168 hours. Cardiac Enzymes:  Recent Labs Lab  09/01/16 2356 09/02/16 0343 09/02/16 1049  TROPONINI 0.04* 0.04* 0.03*   BNP (last 3 results) No results for input(s): PROBNP in the last 8760 hours. HbA1C: No results for input(s): HGBA1C in the last 72 hours. CBG: No results for input(s): GLUCAP in the last 168 hours. Lipid Profile: No results for input(s): CHOL, HDL, LDLCALC, TRIG, CHOLHDL, LDLDIRECT in the last 72 hours. Thyroid Function Tests: No results for input(s): TSH, T4TOTAL, FREET4, T3FREE, THYROIDAB in the last 72 hours. Anemia Panel: No results for input(s): VITAMINB12, FOLATE, FERRITIN, TIBC, IRON, RETICCTPCT in the last 72 hours. Urine analysis: No results found for: COLORURINE, APPEARANCEUR, LABSPEC, PHURINE, GLUCOSEU, HGBUR, BILIRUBINUR, KETONESUR, PROTEINUR, UROBILINOGEN, NITRITE, LEUKOCYTESUR Sepsis Labs: @LABRCNTIP (procalcitonin:4,lacticidven:4)   )No results found for this or any previous visit (from the past 240 hour(s)).    Radiology Studies: Dg Chest 2 View Result Date: 08/30/2016 No active cardiopulmonary disease. Aortic atherosclerosis. Electronically Signed   By: Francene BoyersJames  Maxwell M.D.   On: 08/30/2016 14:58   Ct Angio Chest Pe W And/or Wo Contrast Result Date: 09/01/2016 No demonstrable pulmonary embolus. Patchy infiltrate most consistent with pneumonia in each lower lobe. New 5 mm nodular opacity posterior segment right upper lobe. No follow-up needed if patient is low-risk. Non-contrast chest CT can be considered in 12 months if patient is high-risk. This recommendation follows the consensus statement: Guidelines for Management of Incidental Pulmonary Nodules Detected on CT Images: From the Fleischner Society 2017; Radiology 2017; 284:228-243. No evident adenopathy by size criteria. Extensive atherosclerotic calcification as well as foci of coronary artery calcification. Followup PA and lateral chest radiographs recommended in 3-4 weeks following trial of antibiotic therapy to ensure resolution and exclude  underlying malignancy. Electronically Signed   By: Bretta BangWilliam  Woodruff III M.D.   On: 09/01/2016 19:38      Scheduled Meds: . aspirin EC  81 mg Oral Daily  . azithromycin  500 mg Oral QHS  . cefTRIAXone (ROCEPHIN)  IV  1 g Intravenous Q24H  . cholecalciferol  2,000 Units Oral Daily  . enoxaparin (LOVENOX) injection  40 mg Subcutaneous Q24H  . metoprolol  25 mg Oral Daily  . potassium chloride  10 mEq Oral BID  . riluzole  50 mg Oral Q12H   Continuous Infusions:   LOS: 2 days    Time spent: 25 minutes  Greater than 50% of the time spent on counseling and coordinating the  care.   Manson Passey, MD [facility] Pager [phone]  If 7PM-7AM, please contact night-coverage [email or web address] Password [email or web address] 09/03/2016, 11:36 AM

## 2016-09-04 LAB — BASIC METABOLIC PANEL
ANION GAP: 10 (ref 5–15)
BUN: 10 mg/dL (ref 6–20)
CHLORIDE: 100 mmol/L — AB (ref 101–111)
CO2: 30 mmol/L (ref 22–32)
Calcium: 9.6 mg/dL (ref 8.9–10.3)
Creatinine, Ser: 0.65 mg/dL (ref 0.44–1.00)
Glucose, Bld: 76 mg/dL (ref 65–99)
POTASSIUM: 4.3 mmol/L (ref 3.5–5.1)
SODIUM: 140 mmol/L (ref 135–145)

## 2016-09-04 LAB — CBC
HCT: 35.9 % — ABNORMAL LOW (ref 36.0–46.0)
HEMOGLOBIN: 11.9 g/dL — AB (ref 12.0–15.0)
MCH: 29.2 pg (ref 26.0–34.0)
MCHC: 33.1 g/dL (ref 30.0–36.0)
MCV: 88.2 fL (ref 78.0–100.0)
Platelets: 181 10*3/uL (ref 150–400)
RBC: 4.07 MIL/uL (ref 3.87–5.11)
RDW: 13.6 % (ref 11.5–15.5)
WBC: 5.4 10*3/uL (ref 4.0–10.5)

## 2016-09-04 NOTE — Progress Notes (Signed)
Patient ID: Loletha Bertini, female   DOB: April 23, 1933, 83 y.o.   MRN: [id number]  PROGRESS NOTE    Cherysh Epperly  JXB:[id number] DOB: 1933/03/26 DOA: 09/01/2016  PCP: Cain Saupe, MD   Brief Narrative:  83 y.o. female with history of ALS diagnosed around a year and a half ago, on Riluzole, CAD status post CABG, hypertension who presented to [facility] ED with worsening chest tightness and shortness of breath and cough for past 1 week prior to this admission.  CT angiogram of the chest shows bilateral lower lobe pneumonia. She was started on azithromycin and rocephin  Assessment & Plan:   Principal Problem:   Acute respiratory failure with hypoxia (HCC) / Bilateral lower lobe pneumonia  - CT angiogram of the chest on admission showed bilateral lower lobe pneumonia - Continue azithromycin and rocephin - Strep pneumonia, influenza is negative - Legionella is negative - Sputum cx is pending as of 1/6  Active Problems:   Mild troponin elevation - Likely demand ischemia from hypoxia and pneumonia - No chest pain - Trop stable at 0.04, 0.04, 0.03    ALS (amyotrophic lateral sclerosis) (HCC) - Continue riluzole     CAD (coronary artery disease) s/p CABG - Continue aspirin daily    Essential hypertension - Continue metoprolol    DVT prophylaxis: Lovenox subQ Code Status: DNR/DNI Family Communication: daughter at the bedside this am Disposition Plan: home likely by 09/06/2016   Consultants:   None   Procedures:   None   Antimicrobials:   Azithromycin and rocephin 09/01/2016 -->    Subjective: No overnight events.   Objective: Vitals:   09/03/16 1930 09/03/16 1954 09/04/16 0442 09/04/16 0951  BP:  (!) 163/79 (!) 158/67 128/60  Pulse: 97 93 89 100  Resp: 18 18 18 18   Temp:  98.3 F (36.8 C) 97.4 F (36.3 C)   TempSrc:  Oral Oral   SpO2: 97% 98% 96% 100%  Weight:      Height:        Intake/Output Summary (Last 24 hours) at 09/04/16 1228 Last data filed at 09/04/16 1011  Gross per 24 hour  Intake              540 ml  Output              400 ml  Net              140 ml   Filed Weights   09/01/16 1734 09/03/16 0655  Weight: 52.2 kg (115 lb) 50.2 kg (110 lb 9.6 oz)    Examination:  General exam: Appears calm and comfortable, no distress  Respiratory system: congested, no wheezing  Cardiovascular system: S1 & S2 heard, Rate controlled, SEM +3/6 appreciated  Gastrointestinal system: (+) BS, non tender, non distended  Central nervous system: Nonfocal Extremities: LE edema trace, palpable pulses  Skin: Warm, dry  Psychiatry: No agitation, no restlessness   Data Reviewed: I have personally reviewed following labs and imaging studies  CBC:  Recent Labs Lab 09/01/16 1740 09/01/16 1817 09/01/16 2356 09/02/16 0343 09/04/16 0215  WBC 10.5  --  8.4 7.4 5.4  NEUTROABS 8.2*  --   --  5.2  --   HGB 13.2 13.6 12.9 11.4* 11.9*  HCT 39.6 40.0 40.1 34.8* 35.9*  MCV 89.2  --  89.7 89.5 88.2  PLT 201  --  173 156 181   Basic Metabolic Panel:  Recent Labs Lab 09/01/16 1740 09/01/16 1817 09/01/16 2356 09/02/16 0343 09/04/16  0215  NA 141 140  --  137 140  K 3.8 4.1  --  3.6 4.3  CL 101 100*  --  99* 100*  CO2 29  --   --  31 30  GLUCOSE 81 82  --  93 76  BUN 17 24*  --  13 10  CREATININE 0.76 0.80 0.64 0.59 0.65  CALCIUM 10.0  --   --  9.2 9.6   GFR: Estimated Creatinine Clearance: 43.7 mL/min (by C-G formula based on SCr of 0.65 mg/dL). Liver Function Tests:  Recent Labs Lab 09/01/16 1740 09/02/16 0343  AST 21 19  ALT 14 13*  ALKPHOS 64 53  BILITOT 0.6 0.5  PROT 7.2 6.0*  ALBUMIN 3.7 2.9*   No results for input(s): LIPASE, AMYLASE in the last 168 hours. No results for input(s): AMMONIA in the last 168 hours. Coagulation Profile: No results for input(s): INR, PROTIME in the last 168 hours. Cardiac Enzymes:  Recent Labs Lab 09/01/16 2356 09/02/16 0343 09/02/16 1049  TROPONINI 0.04* 0.04* 0.03*   BNP (last 3 results) No  results for input(s): PROBNP in the last 8760 hours. HbA1C: No results for input(s): HGBA1C in the last 72 hours. CBG: No results for input(s): GLUCAP in the last 168 hours. Lipid Profile: No results for input(s): CHOL, HDL, LDLCALC, TRIG, CHOLHDL, LDLDIRECT in the last 72 hours. Thyroid Function Tests: No results for input(s): TSH, T4TOTAL, FREET4, T3FREE, THYROIDAB in the last 72 hours. Anemia Panel: No results for input(s): VITAMINB12, FOLATE, FERRITIN, TIBC, IRON, RETICCTPCT in the last 72 hours. Urine analysis: No results found for: COLORURINE, APPEARANCEUR, LABSPEC, PHURINE, GLUCOSEU, HGBUR, BILIRUBINUR, KETONESUR, PROTEINUR, UROBILINOGEN, NITRITE, LEUKOCYTESUR Sepsis Labs: @LABRCNTIP (procalcitonin:4,lacticidven:4)   )No results found for this or any previous visit (from the past 240 hour(s)).    Radiology Studies: Dg Chest 2 View Result Date: 08/30/2016 No active cardiopulmonary disease. Aortic atherosclerosis. Electronically Signed   By: Francene BoyersJames  Maxwell M.D.   On: 08/30/2016 14:58   Ct Angio Chest Pe W And/or Wo Contrast Result Date: 09/01/2016 No demonstrable pulmonary embolus. Patchy infiltrate most consistent with pneumonia in each lower lobe. New 5 mm nodular opacity posterior segment right upper lobe. No follow-up needed if patient is low-risk. Non-contrast chest CT can be considered in 12 months if patient is high-risk. This recommendation follows the consensus statement: Guidelines for Management of Incidental Pulmonary Nodules Detected on CT Images: From the Fleischner Society 2017; Radiology 2017; 284:228-243. No evident adenopathy by size criteria. Extensive atherosclerotic calcification as well as foci of coronary artery calcification. Followup PA and lateral chest radiographs recommended in 3-4 weeks following trial of antibiotic therapy to ensure resolution and exclude underlying malignancy. Electronically Signed   By: Bretta BangWilliam  Woodruff III M.D.   On: 09/01/2016 19:38       Scheduled Meds: . aspirin EC  81 mg Oral Daily  . azithromycin  500 mg Oral QHS  . cefTRIAXone (ROCEPHIN)  IV  1 g Intravenous Q24H  . cholecalciferol  2,000 Units Oral Daily  . metoprolol  25 mg Oral Daily  . potassium chloride  10 mEq Oral BID  . riluzole  50 mg Oral Q12H   Continuous Infusions:   LOS: 3 days    Time spent: 15 minutes  Greater than 50% of the time spent on counseling and coordinating the care.   Manson PasseyEVINE, Gitel Beste, MD [facility] Pager [phone]  If 7PM-7AM, please contact night-coverage [email or web address] Password [email or web address] 09/04/2016, 12:28 PM

## 2016-09-04 NOTE — Evaluation (Signed)
Physical Therapy Evaluation Patient Details Name: Barbara Collins MRN: [id number] DOB: 03-Nov-1932 Today's Date: 09/04/2016   History of Present Illness  Pt is an 83 y/o female admitted secondary to SOB and cough, found to have pneumonia. PMH including but not limited to ALS, CAD and HTN.  Clinical Impression  Pt presented sitting OOB in chair when PT entered room. Prior to admission, pt reported that she was mod I with functional mobility, using a cane when ambulating. Pt currently requires min guard for transfers (min A to stand from lower surface) and min guard for safety with ambulation and use of her cane. Pt tolerated evaluation very well. Pt's SPO2 on RA in sitting was 96%. Attempted to get a reading after ambulation on RA; however, pulse oximeter could not read (likely secondary to pt's hands being very cold, therapist attempted to warm them in her hands but was unsuccessful). Pt would continue to benefit from skilled physical therapy services at this time while admitted to address her below listed limitations in order to improve her overall safety and independence with functional mobility.      Follow Up Recommendations No PT follow up    Equipment Recommendations  None recommended by PT    Recommendations for Other Services       Precautions / Restrictions Precautions Precautions: Fall Restrictions Weight Bearing Restrictions: No      Mobility  Bed Mobility               General bed mobility comments: pt sitting OOB in chair when PT entered room  Transfers Overall transfer level: Needs assistance Equipment used: Straight cane Transfers: Sit to/from Stand Sit to Stand: Min guard;Min assist         General transfer comment: min guard to rise from chair, min A to rise from toilet in bathroom  Ambulation/Gait Ambulation/Gait assistance: Min guard Ambulation Distance (Feet): 100 Feet Assistive device: Straight cane Gait Pattern/deviations: Step-through  pattern;Decreased step length - right;Decreased step length - left;Decreased stride length;Trunk flexed Gait velocity: decreased Gait velocity interpretation: Below normal speed for age/gender General Gait Details: pt using cane in L UE, no instability or LOB noted, min guard for safety  Stairs            Wheelchair Mobility    Modified Rankin (Stroke Patients Only)       Balance Overall balance assessment: Needs assistance Sitting-balance support: Feet supported;No upper extremity supported Sitting balance-Leahy Scale: Good     Standing balance support: During functional activity;No upper extremity supported Standing balance-Leahy Scale: Fair                               Pertinent Vitals/Pain Pain Assessment: No/denies pain    Home Living Family/patient expects to be discharged to:: Private residence Living Arrangements: Children Available Help at Discharge: Family;Available 24 hours/day Type of Home: House Home Access: Level entry     Home Layout: One level Home Equipment: Wheelchair - manual;Cane - single point      Prior Function Level of Independence: Independent with assistive device(s)         Comments: pt ambulates with use of cane     Hand Dominance   Dominant Hand: Left (secondary to weakness in R UE)    Extremity/Trunk Assessment   Upper Extremity Assessment Upper Extremity Assessment: RUE deficits/detail RUE Deficits / Details: pt with no active movement of fingers or wrist with visible atrophy of thenar and  hypothenar eminences.    Lower Extremity Assessment Lower Extremity Assessment: Generalized weakness       Communication   Communication: No difficulties  Cognition Arousal/Alertness: Awake/alert Behavior During Therapy: WFL for tasks assessed/performed Overall Cognitive Status: No family/caregiver present to determine baseline cognitive functioning Area of Impairment: Memory     Memory: Decreased short-term  memory         General Comments: pt requesting to use the bathroom prior to ambulating in hallway and after several minutes had passed, therapist suggested to walk to the bathroom and pt asked "who needs to go there?". Also pt very forgetful of her cane. Therapist had to retrieve cane and remind her multiple times that she uses it when ambulating.     General Comments      Exercises     Assessment/Plan    PT Assessment Patient needs continued PT services  PT Problem List Decreased balance;Decreased mobility;Decreased coordination;Decreased knowledge of use of DME;Decreased safety awareness          PT Treatment Interventions DME instruction;Gait training;Stair training;Therapeutic activities;Functional mobility training;Neuromuscular re-education;Balance training;Therapeutic exercise;Patient/family education    PT Goals (Current goals can be found in the Care Plan section)  Acute Rehab PT Goals Patient Stated Goal: return home PT Goal Formulation: With patient Time For Goal Achievement: 09/18/16 Potential to Achieve Goals: Good    Frequency Min 3X/week   Barriers to discharge        Co-evaluation               End of Session Equipment Utilized During Treatment: Gait belt Activity Tolerance: Patient tolerated treatment well Patient left: in chair;with call bell/phone within reach;with chair alarm set Nurse Communication: Mobility status         Time: 1524-1600 PT Time Calculation (min) (ACUTE ONLY): 36 min   Charges:   PT Evaluation $PT Eval Moderate Complexity: 1 Procedure PT Treatments $Gait Training: 8-22 mins   PT G CodesAlessandra Bevels:        [place] Dohmen M Lea Walbert 09/04/2016, 5:14 PM Deborah ChalkJennifer Rocco Kerkhoff, PT, DPT [phone]

## 2016-09-04 NOTE — Progress Notes (Signed)
NIF -18, good effort

## 2016-09-04 NOTE — Progress Notes (Signed)
Patient performed a NIF of -18 with good effort. Best of three attempts.

## 2016-09-05 LAB — CBC
HCT: 36.5 % (ref 36.0–46.0)
Hemoglobin: 11.8 g/dL — ABNORMAL LOW (ref 12.0–15.0)
MCH: 28.6 pg (ref 26.0–34.0)
MCHC: 32.3 g/dL (ref 30.0–36.0)
MCV: 88.6 fL (ref 78.0–100.0)
PLATELETS: 180 10*3/uL (ref 150–400)
RBC: 4.12 MIL/uL (ref 3.87–5.11)
RDW: 13.5 % (ref 11.5–15.5)
WBC: 7.2 10*3/uL (ref 4.0–10.5)

## 2016-09-05 NOTE — Progress Notes (Signed)
Patient ID: Barbara Collins, female   DOB: [birth year], 83 y.o.   MRN: [id number]  PROGRESS NOTE    Barbara Collins  WUJ:[id number] DOB: April 22, 1933 DOA: 09/01/2016  PCP: Cain Saupe, MD   Brief Narrative:  83 y.o. female with history of ALS diagnosed around a year and a half ago, on Riluzole, CAD status post CABG, hypertension who presented to [facility] ED with worsening chest tightness and shortness of breath and cough for past 1 week prior to this admission.  CT angiogram of the chest shows bilateral lower lobe pneumonia. She was started on azithromycin and rocephin  Assessment & Plan:   Principal Problem:   Acute respiratory failure with hypoxia (HCC) / Bilateral lower lobe pneumonia  - CT angiogram of the chest on admission showed bilateral lower lobe pneumonia - We will continue current abx, azithro and rocephin - Stable resp status but still has cough this am - Strep pneumonia, influenza is negative - Legionella is negative - Sputum cx is pending as of 1/6 - Continue antitussive PRN  Active Problems:   Mild troponin elevation - Likely demand ischemia from hypoxia and pneumonia - No chest pain - Trop stable at 0.04, 0.04, 0.03    ALS (amyotrophic lateral sclerosis) (HCC) - Continue riluzole     CAD (coronary artery disease) s/p CABG - Continue aspirin daily    Essential hypertension - Continue metoprolol    DVT prophylaxis: Lovenox subQ Code Status: DNR/DNI Family Communication: daughter at the bedside this am Disposition Plan: home likely by 09/06/2016   Consultants:   NPT - no PT follow up required    Procedures:   None   Antimicrobials:   Azithromycin and rocephin 09/01/2016 -->    Subjective: No overnight events.   Objective: Vitals:   09/04/16 0442 09/04/16 0951 09/04/16 1959 09/05/16 0657  BP: (!) 158/67 128/60 (!) 167/64 130/70  Pulse: 89 100 95 86  Resp: 18 18 20 18   Temp: 97.4 F (36.3 C)  98.3 F (36.8 C) 97.6 F (36.4 C)  TempSrc: Oral  Oral Oral    SpO2: 96% 100% 91% 98%  Weight:      Height:        Intake/Output Summary (Last 24 hours) at 09/05/16 1041 Last data filed at 09/05/16 9562  Gross per 24 hour  Intake              380 ml  Output              800 ml  Net             -420 ml   Filed Weights   09/01/16 1734 09/03/16 0655  Weight: 52.2 kg (115 lb) 50.2 kg (110 lb 9.6 oz)    Examination:  General exam: No distress Respiratory system: no wheezing, no rhonchi  Cardiovascular system: S1 & S2 heard, Rate controlled, SEM +3/6 appreciated  Gastrointestinal system: (+) BS, non distended  Central nervous system: No focal deficits  Extremities: LE edema trace, palpable pulses  Skin: no lesions, no ulcers  Psychiatry: Normal mood and behavior    Data Reviewed: I have personally reviewed following labs and imaging studies  CBC:  Recent Labs Lab 09/01/16 1740 09/01/16 1817 09/01/16 2356 09/02/16 0343 09/04/16 0215 09/05/16 0312  WBC 10.5  --  8.4 7.4 5.4 7.2  NEUTROABS 8.2*  --   --  5.2  --   --   HGB 13.2 13.6 12.9 11.4* 11.9* 11.8*  HCT 39.6 40.0 40.1 34.8* 35.9*  36.5  MCV 89.2  --  89.7 89.5 88.2 88.6  PLT 201  --  173 156 181 180   Basic Metabolic Panel:  Recent Labs Lab 09/01/16 1740 09/01/16 1817 09/01/16 2356 09/02/16 0343 09/04/16 0215  NA 141 140  --  137 140  K 3.8 4.1  --  3.6 4.3  CL 101 100*  --  99* 100*  CO2 29  --   --  31 30  GLUCOSE 81 82  --  93 76  BUN 17 24*  --  13 10  CREATININE 0.76 0.80 0.64 0.59 0.65  CALCIUM 10.0  --   --  9.2 9.6   GFR: Estimated Creatinine Clearance: 43.7 mL/min (by C-G formula based on SCr of 0.65 mg/dL). Liver Function Tests:  Recent Labs Lab 09/01/16 1740 09/02/16 0343  AST 21 19  ALT 14 13*  ALKPHOS 64 53  BILITOT 0.6 0.5  PROT 7.2 6.0*  ALBUMIN 3.7 2.9*   No results for input(s): LIPASE, AMYLASE in the last 168 hours. No results for input(s): AMMONIA in the last 168 hours. Coagulation Profile: No results for input(s): INR, PROTIME  in the last 168 hours. Cardiac Enzymes:  Recent Labs Lab 09/01/16 2356 09/02/16 0343 09/02/16 1049  TROPONINI 0.04* 0.04* 0.03*   BNP (last 3 results) No results for input(s): PROBNP in the last 8760 hours. HbA1C: No results for input(s): HGBA1C in the last 72 hours. CBG: No results for input(s): GLUCAP in the last 168 hours. Lipid Profile: No results for input(s): CHOL, HDL, LDLCALC, TRIG, CHOLHDL, LDLDIRECT in the last 72 hours. Thyroid Function Tests: No results for input(s): TSH, T4TOTAL, FREET4, T3FREE, THYROIDAB in the last 72 hours. Anemia Panel: No results for input(s): VITAMINB12, FOLATE, FERRITIN, TIBC, IRON, RETICCTPCT in the last 72 hours. Urine analysis: No results found for: COLORURINE, APPEARANCEUR, LABSPEC, PHURINE, GLUCOSEU, HGBUR, BILIRUBINUR, KETONESUR, PROTEINUR, UROBILINOGEN, NITRITE, LEUKOCYTESUR Sepsis Labs: @LABRCNTIP (procalcitonin:4,lacticidven:4)   )No results found for this or any previous visit (from the past 240 hour(s)).    Radiology Studies: Dg Chest 2 View Result Date: 08/30/2016 No active cardiopulmonary disease. Aortic atherosclerosis. Electronically Signed   By: Francene BoyersJames  Maxwell M.D.   On: 08/30/2016 14:58   Ct Angio Chest Pe W And/or Wo Contrast Result Date: 09/01/2016 No demonstrable pulmonary embolus. Patchy infiltrate most consistent with pneumonia in each lower lobe. New 5 mm nodular opacity posterior segment right upper lobe. No follow-up needed if patient is low-risk. Non-contrast chest CT can be considered in 12 months if patient is high-risk. This recommendation follows the consensus statement: Guidelines for Management of Incidental Pulmonary Nodules Detected on CT Images: From the Fleischner Society 2017; Radiology 2017; 284:228-243. No evident adenopathy by size criteria. Extensive atherosclerotic calcification as well as foci of coronary artery calcification. Followup PA and lateral chest radiographs recommended in 3-4 weeks following  trial of antibiotic therapy to ensure resolution and exclude underlying malignancy. Electronically Signed   By: Bretta BangWilliam  Woodruff III M.D.   On: 09/01/2016 19:38      Scheduled Meds: . aspirin EC  81 mg Oral Daily  . azithromycin  500 mg Oral QHS  . cefTRIAXone (ROCEPHIN)  IV  1 g Intravenous Q24H  . cholecalciferol  2,000 Units Oral Daily  . metoprolol  25 mg Oral Daily  . potassium chloride  10 mEq Oral BID  . riluzole  50 mg Oral Q12H   Continuous Infusions:   LOS: 4 days    Time spent: 15 minutes  Greater  than 50% of the time spent on counseling and coordinating the care.   Manson Passey, MD [facility] Pager [phone]  If 7PM-7AM, please contact night-coverage [email or web address] Password [email or web address] 09/05/2016, 10:41 AM

## 2016-09-05 NOTE — Progress Notes (Signed)
Patient performed a NIF of -18 with three attempts.

## 2016-09-05 NOTE — Progress Notes (Signed)
NIF completed, -18 with good effort. RT will continue to monitor.

## 2016-09-06 LAB — CBC
HCT: 36.8 % (ref 36.0–46.0)
Hemoglobin: 11.9 g/dL — ABNORMAL LOW (ref 12.0–15.0)
MCH: 28.9 pg (ref 26.0–34.0)
MCHC: 32.3 g/dL (ref 30.0–36.0)
MCV: 89.3 fL (ref 78.0–100.0)
PLATELETS: 188 10*3/uL (ref 150–400)
RBC: 4.12 MIL/uL (ref 3.87–5.11)
RDW: 13.8 % (ref 11.5–15.5)
WBC: 7.9 10*3/uL (ref 4.0–10.5)

## 2016-09-06 LAB — BASIC METABOLIC PANEL
Anion gap: 7 (ref 5–15)
BUN: 12 mg/dL (ref 6–20)
CO2: 33 mmol/L — ABNORMAL HIGH (ref 22–32)
Calcium: 9.5 mg/dL (ref 8.9–10.3)
Chloride: 99 mmol/L — ABNORMAL LOW (ref 101–111)
Creatinine, Ser: 0.64 mg/dL (ref 0.44–1.00)
GFR calc Af Amer: >60 mL/min (ref >60)
GLUCOSE: 86 mg/dL (ref 65–99)
POTASSIUM: 4.1 mmol/L (ref 3.5–5.1)
Sodium: 139 mmol/L (ref 135–145)

## 2016-09-06 MED ORDER — LEVOFLOXACIN 500 MG PO TABS: 500.0000 mg | ORAL_TABLET | Freq: Every day | ORAL | 0 refills | Status: DC

## 2016-09-06 MED ORDER — ACETAMINOPHEN 325 MG PO TABS: 650.0000 mg | ORAL_TABLET | Freq: Four times a day (QID) | ORAL | 0 refills | Status: DC | PRN

## 2016-09-06 MED ORDER — DIPHENHYDRAMINE HCL 25 MG PO CAPS: 25.0000 mg | ORAL_CAPSULE | Freq: Every evening | ORAL | 0 refills | Status: DC | PRN

## 2016-09-06 MED ORDER — GUAIFENESIN 100 MG/5ML PO SOLN: 5.0000 mL | ORAL | 0 refills | Status: DC | PRN

## 2016-09-06 NOTE — Discharge Summary (Signed)
Physician Discharge Summary  Barbara PateeHazel Cronin ZOX:[id number] DOB: August 29, 1933 DOA: 09/01/2016  PCP: Cain SaupeFULP, CAMMIE, MD  Admit date: 09/01/2016 Discharge date: 09/06/2016  Recommendations for Outpatient Follow-up:  Continue Levaquin 500 mg daily for 5 days and discharged for pneumonia.  Discharge Diagnoses:  Principal Problem:   Acute respiratory failure with hypoxia (HCC) Active Problems:   CAP (community acquired pneumonia)   ALS (amyotrophic lateral sclerosis) (HCC)   CAD (coronary artery disease) s/p CABG.   Hypertension    Discharge Condition: stable   Diet recommendation: as tolerated   History of present illness:  83 y.o.femalewith history of ALS diagnosed around a year and a half ago, on Riluzole,CAD status post CABG, hypertension who [facility] with worsening chest tightness and shortness of breath and cough for past 1 week prior to this admission.  CT angiogram of the chest shows bilateral lower lobe pneumonia. She was started on azithromycin and rocephin  Hospital Course:   Assessment & Plan:   Principal Problem:   Acute respiratory failure with hypoxia (HCC) / Bilateral lower lobe pneumonia  - CT angiogram of the chest on admission showed bilateral lower lobe pneumonia - Stop azithro and rocephin. Continue Levaquin for 5 more days on discharge  - Stable resp status  - Strep pneumonia, influenza is negative - Legionella is negative - Continue antitussive PRN  Active Problems:   Mild troponin elevation - Likely demand ischemia from hypoxia and pneumonia - No chest pain - Trop stable at 0.04, 0.04, 0.03    ALS (amyotrophic lateral sclerosis) (HCC) - Continue riluzole     CAD (coronary artery disease) s/p CABG - Continue aspirin daily    Essential hypertension - Continue metoprolol    DVT prophylaxis: Lovenox subQ Code Status: DNR/DNI Family Communication: daughter at the bedside 1/7. We already spoke about discharge plan for today. Pt feels  much better and wants to go home today     Consultants:   NPT - no PT follow up required    Procedures:   None   Antimicrobials:   Azithromycin and rocephin 09/01/2016 --> 09/06/2016   Signed:  Manson PasseyEVINE, Kayceon Oki, MD  [facility] 09/06/2016, 10:36 AM  Pager #: [phone]  Time spent in minutes: less than 30 minutes   Discharge Exam: Vitals:   09/05/16 2111 09/06/16 0724  BP: (!) 151/83 139/73  Pulse: (!) 106 90  Resp: 18 18  Temp: 98.2 F (36.8 C) 97.4 F (36.3 C)   Vitals:   09/05/16 1116 09/05/16 1616 09/05/16 2111 09/06/16 0724  BP:  (!) 139/92 (!) 151/83 139/73  Pulse:  (!) 115 (!) 106 90  Resp:  18 18 18   Temp:  97.8 F (36.6 C) 98.2 F (36.8 C) 97.4 F (36.3 C)  TempSrc:  Oral Oral Oral  SpO2: (!) 88% 95% 94% 94%  Weight:      Height:        General: Pt is alert, follows commands appropriately, not in acute distress Cardiovascular: Regular rate and rhythm, S1/S2 + Respiratory: Clear to auscultation bilaterally, no wheezing, no crackles, no rhonchi Abdominal: Soft, non tender, non distended, bowel sounds +, no guarding Extremities: no edema, no cyanosis, pulses palpable bilaterally DP and PT Neuro: Grossly nonfocal  Discharge Instructions  Discharge Instructions    Call MD for:  persistant nausea and vomiting    Complete by:  As directed    Call MD for:  redness, tenderness, or signs of infection (pain, swelling, redness, odor or green/yellow discharge around incision  site)    Complete by:  As directed    Call MD for:  severe uncontrolled pain    Complete by:  As directed    Diet - low sodium heart healthy    Complete by:  As directed    Discharge instructions    Complete by:  As directed    Continue Levaquin 500 mg daily for 5 days and discharged for pneumonia.   Increase activity slowly    Complete by:  As directed      Allergies as of 09/06/2016      Reactions   Codeine Shortness Of Breath, Nausea Only      Medication List     TAKE these medications   acetaminophen 325 MG tablet Commonly known as:  TYLENOL Take 2 tablets (650 mg total) by mouth every 6 (six) hours as needed for mild pain (or Fever >/= 101).   aspirin EC 81 MG tablet Take 81 mg by mouth daily.   diphenhydrAMINE 25 mg capsule Commonly known as:  BENADRYL Take 1 capsule (25 mg total) by mouth at bedtime as needed for sleep (give with Tylenol to make Tylenol pm).   guaiFENesin 100 MG/5ML Soln Commonly known as:  ROBITUSSIN Take 5 mLs (100 mg total) by mouth every 4 (four) hours as needed for cough or to loosen phlegm.   ibuprofen 200 MG tablet Commonly known as:  ADVIL,MOTRIN Take 200 mg by mouth every 6 (six) hours as needed.   levofloxacin 500 MG tablet Commonly known as:  LEVAQUIN Take 1 tablet (500 mg total) by mouth daily.   metoprolol 50 MG tablet Commonly known as:  LOPRESSOR Take 25 mg by mouth daily.   potassium chloride 10 MEQ tablet Commonly known as:  K-DUR,KLOR-CON Take 10 mEq by mouth 2 (two) times daily.   riluzole 50 MG tablet Commonly known as:  RILUTEK Take 1 tablet (50 mg total) by mouth every 12 (twelve) hours.   Vitamin D3 2000 units capsule Take 2,000 Units by mouth daily.      Follow-up Information    FULP, CAMMIE, MD. Schedule an appointment as soon as possible for a visit in 1 week(s).   Specialty:  Family Medicine Contact information: [address] [place] [address] [phone]            The results of significant diagnostics from this hospitalization (including imaging, microbiology, ancillary and laboratory) are listed below for reference.    Significant Diagnostic Studies: Dg Chest 2 View  Result Date: 08/30/2016 CLINICAL DATA:  Nonproductive cough and shortness of breath. EXAM: CHEST  2 VIEW COMPARISON:  CT scan of the chest dated 01/13/2015 FINDINGS: Heart size and pulmonary vascularity are normal and the lungs are clear. Aortic atherosclerosis. Chronic thoracolumbar  scoliosis. IMPRESSION: No active cardiopulmonary disease. Aortic atherosclerosis. Electronically Signed   By: Francene Boyers M.D.   On: 08/30/2016 14:58   Ct Angio Chest Pe W And/or Wo Contrast  Result Date: 09/01/2016 CLINICAL DATA:  Shortness of breath and cough EXAM: CT ANGIOGRAPHY CHEST WITH CONTRAST TECHNIQUE: Multidetector CT imaging of the chest was performed using the standard protocol during bolus administration of intravenous contrast. Multiplanar CT image reconstructions and MIPs were obtained to evaluate the vascular anatomy. CONTRAST:  100 mL Isovue 370 nonionic COMPARISON:  Chest CT Jan 13, 2015; chest radiograph August 30, 2016 FINDINGS: Cardiovascular: There is no demonstrable pulmonary embolus. There is no appreciable thoracic aortic aneurysm or dissection. There are foci of calcification throughout the proximal great  vessels bilaterally. There is calcification throughout the aorta. There are foci of coronary artery calcification. Pericardium is not appreciably thickened. Mediastinum/Nodes: Thyroid appears normal. There are scattered subcentimeter mediastinal lymph nodes bilaterally. By size criteria, there is no adenopathy in the thoracic region. Lungs/Pleura: On axial slice 47 series 5, there is a 5 mm nodular opacity in the posterior segment of the right upper lobe. There is patchy airspace consolidation in both lower lobes, felt to represent bilateral lower lobe pneumonia. There is slight upper lobe bronchiectatic change bilaterally. There is no appreciable pleural effusion or pleural thickening. Upper Abdomen: In the visualized upper abdomen, there is atherosclerotic calcification in the aorta. There appears to be mild adrenal hypertrophy in the incompletely visualized left adrenal. Musculoskeletal: Patient is status post median sternotomy. There is degenerative change in the thoracic spine. No blastic or lytic bone lesions are appreciable. There is degenerative change in each shoulder.  Review of the MIP images confirms the above findings. IMPRESSION: No demonstrable pulmonary embolus. Patchy infiltrate most consistent with pneumonia in each lower lobe. New 5 mm nodular opacity posterior segment right upper lobe. No follow-up needed if patient is low-risk. Non-contrast chest CT can be considered in 12 months if patient is high-risk. This recommendation follows the consensus statement: Guidelines for Management of Incidental Pulmonary Nodules Detected on CT Images: From the Fleischner Society 2017; Radiology 2017; 284:228-243. No evident adenopathy by size criteria. Extensive atherosclerotic calcification as well as foci of coronary artery calcification. Followup PA and lateral chest radiographs recommended in 3-4 weeks following trial of antibiotic therapy to ensure resolution and exclude underlying malignancy. Electronically Signed   By: Bretta Bang III M.D.   On: 09/01/2016 19:38    Microbiology: No results found for this or any previous visit (from the past 240 hour(s)).   Labs: Basic Metabolic Panel:  Recent Labs Lab 09/01/16 1740 09/01/16 1817 09/01/16 2356 09/02/16 0343 09/04/16 0215 09/06/16 0437  NA 141 140  --  137 140 139  K 3.8 4.1  --  3.6 4.3 4.1  CL 101 100*  --  99* 100* 99*  CO2 29  --   --  31 30 33*  GLUCOSE 81 82  --  93 76 86  BUN 17 24*  --  13 10 12   CREATININE 0.76 0.80 0.64 0.59 0.65 0.64  CALCIUM 10.0  --   --  9.2 9.6 9.5   Liver Function Tests:  Recent Labs Lab 09/01/16 1740 09/02/16 0343  AST 21 19  ALT 14 13*  ALKPHOS 64 53  BILITOT 0.6 0.5  PROT 7.2 6.0*  ALBUMIN 3.7 2.9*   No results for input(s): LIPASE, AMYLASE in the last 168 hours. No results for input(s): AMMONIA in the last 168 hours. CBC:  Recent Labs Lab 09/01/16 1740  09/01/16 2356 09/02/16 0343 09/04/16 0215 09/05/16 0312 09/06/16 0437  WBC 10.5  --  8.4 7.4 5.4 7.2 7.9  NEUTROABS 8.2*  --   --  5.2  --   --   --   HGB 13.2  < > 12.9 11.4* 11.9*  11.8* 11.9*  HCT 39.6  < > 40.1 34.8* 35.9* 36.5 36.8  MCV 89.2  --  89.7 89.5 88.2 88.6 89.3  PLT 201  --  173 156 181 180 188  < > = values in this interval not displayed. Cardiac Enzymes:  Recent Labs Lab 09/01/16 2356 09/02/16 0343 09/02/16 1049  TROPONINI 0.04* 0.04* 0.03*   BNP: BNP (last 3 results)  Recent Labs  09/01/16 1740  BNP 172.7*    ProBNP (last 3 results) No results for input(s): PROBNP in the last 8760 hours.  CBG: No results for input(s): GLUCAP in the last 168 hours.

## 2016-09-06 NOTE — Discharge Instructions (Signed)
Levofloxacin tablets °What is this medicine? °LEVOFLOXACIN (lee voe FLOX a sin) is a quinolone antibiotic. It is used to treat certain kinds of bacterial infections. It will not work for colds, flu, or other viral infections. °This medicine may be used for other purposes; ask your health care provider or pharmacist if you have questions. °COMMON BRAND NAME(S): Levaquin, Levaquin Leva-Pak °What should I tell my health care provider before I take this medicine? °They need to know if you have any of these conditions: °-bone problems °-diabetes °-history of low levels of potassium in the blood °-irregular heartbeat °-joint problems °-kidney disease °-liver disease °-myasthenia gravis °-seizures °-tendon problems °-tingling of the fingers or toes, or other nerve disorder °-an unusual or allergic reaction to levofloxacin, other quinolone antibiotics, foods, dyes, or preservatives °-pregnant or trying to get pregnant °-breast-feeding °How should I use this medicine? °Take this medicine by mouth with a full glass of water. Follow the directions on the prescription label. This medicine can be taken with or without food. Take your medicine at regular intervals. Do not take your medicine more often than directed. Do not skip doses or stop your medicine early even if you feel better. Do not stop taking except on your doctor's advice. °A special MedGuide will be given to you by the pharmacist with each prescription and refill. Be sure to read this information carefully each time. °Talk to your pediatrician regarding the use of this medicine in children. While this drug may be prescribed for children as young as 6 months for selected conditions, precautions do apply. °Overdosage: If you think you have taken too much of this medicine contact a poison control center or emergency room at once. °NOTE: This medicine is only for you. Do not share this medicine with others. °What if I miss a dose? °If you miss a dose, take it as soon as  you remember. If it is almost time for your next dose, take only that dose. Do not take double or extra doses. °What may interact with this medicine? °Do not take this medicine with any of the following medications: °-bepridil °-certain medicines for depression, anxiety, or psychotic disturbances like pimozide, thioridazine, and ziprasidone °-certain medicines for irregular heart beat like dofetilide and dronedarone °-cisapride °-halofantrine °This medicine may also interact with the following medications: °-antacids °-birth control pills °-certain medicines for diabetes, like glipizide, glyburide, or insulin °-didanosine buffered tablets or powder °-multivitamins °-NSAIDS, medicines for pain and inflammation, like ibuprofen or naproxen °-steroid medicines like prednisone or cortisone °-sucralfate °-theophylline °-warfarin °This list may not describe all possible interactions. Give your health care provider a list of all the medicines, herbs, non-prescription drugs, or dietary supplements you use. Also tell them if you smoke, drink alcohol, or use illegal drugs. Some items may interact with your medicine. °What should I watch for while using this medicine? °Tell your doctor or healthcare professional if your symptoms do not start to get better or if they get worse. °Do not treat diarrhea with over the counter products. Contact your doctor if you have diarrhea that lasts more than 2 days or if it is severe and watery. °Check with your doctor or health care professional if you get an attack of severe diarrhea, nausea and vomiting, or if you sweat a lot. The loss of too much body fluid can make it dangerous for you to take this medicine. °You may get drowsy or dizzy. Do not drive, use machinery, or do anything that needs mental alertness until you   know how this medicine affects you. Do not sit or stand up quickly, especially if you are an older patient. This reduces the risk of dizzy or fainting spells. °This medicine  can make you more sensitive to the sun. Keep out of the sun. If you cannot avoid being in the sun, wear protective clothing and use a sunscreen. Do not use sun lamps or tanning beds/booths. Contact your doctor if you get a sunburn. °If you are a diabetic monitor your blood glucose carefully. If you get an unusual reading stop taking this medicine and call your doctor right away. °Avoid antacids, calcium, iron, and zinc products for 2 hours before and 2 hours after taking a dose of this medicine. °What side effects may I notice from receiving this medicine? °Side effects that you should report to your doctor or health care professional as soon as possible: °-allergic reactions like skin rash or hives, swelling of the face, lips, or tongue °-anxious °-breathing problems °-confusion °-depressed mood °-diarrhea °-dizziness °-fast, irregular heartbeat °-hallucination, loss of contact with reality °-joint, muscle, or tendon pain or swelling °-muscle weakness °-pain, tingling, numbness in the hands or feet °-seizures °-signs and symptoms of high blood sugar such as dizziness; dry mouth; dry skin; fruity breath; nausea; stomach pain; increased hunger or thirst; increased urination °-signs and symptoms of liver injury like dark yellow or brown urine; general ill feeling or flu-like symptoms; light-colored stools; loss of appetite; nausea; right upper belly pain; unusually weak or tired; yellowing of the eyes or skin °-signs and symptoms of low blood sugar such as feeling anxious; confusion; dizziness; increased hunger; unusually weak or tired; sweating; shakiness; cold; irritable; headache; blurred vision; fast heartbeat; loss of consciousness °-suicidal thoughts or other mood changes °-sunburn °-unusually weak or tired °Side effects that usually do not require medical attention (report to your doctor or health care professional if they continue or are bothersome): °-constipation °-dry mouth °-headache °-nausea,  vomiting °-trouble sleeping °This list may not describe all possible side effects. Call your doctor for medical advice about side effects. You may report side effects to FDA at 1-800-FDA-1088. °Where should I keep my medicine? °Keep out of the reach of children. °Store at room temperature between 15 and 30 degrees C (59 and 86 degrees F). Keep in a tightly closed container. Throw away any unused medicine after the expiration date. °NOTE: This sheet is a summary. It may not cover all possible information. If you have questions about this medicine, talk to your doctor, pharmacist, or health care provider. °© 2017 [employer] (2016-02-24 12:38:27) ° °

## 2016-09-06 NOTE — Progress Notes (Signed)
Order to discharge received.  IV and telemetry removed, CCMD notified.   

## 2016-09-06 NOTE — Care Management Note (Signed)
Case Management Note Donn PieriniKristi Shonta Bourque RN, BSN Unit 2W-Case Manager [phone]  Patient Details  Name: Alferd PateeHazel Collins MRN: [id number] Date of Birth: 11/10/1932  Subjective/Objective:  Pt admitted with PNA-acute resp. Failure with hypoxia                  Action/Plan: PTA pt lived at home with daughter- per conversation with pt at bedside- pt lives with daughter and 7 yr. Old granddaughter- daughter works here per pt at night and has a friend that stays with pt and granddaughter while daughter works. Per pt she has a cane, walker and w/c at home and no other DME at this time. Currently on 02-2L- may need to assess for home 02 needs prior to discharge if unable to wean off 02-  Pt also states that [facility], has a PCP here in town that recently is no longer with practice-Dr. Jillyn HiddenFulp- and pt is seeing a new doctor within the practice but does not remember the name.  PT eval pending- anticipate return home with daughter- CM will follow for potential needs.   Expected Discharge Date:   09/06/16           Expected Discharge Plan:  Home w Home Health Services  In-House Referral:     Discharge planning Services  CM Consult  Post Acute Care Choice:  NA Choice offered to:  NA  DME Arranged:    DME Agency:     HH Arranged:    HH Agency:     Status of Service:  Completed, signed off  If discussed at [employer] Length of Stay Meetings, dates discussed:    Discharge Disposition: home/self care   Additional Comments:  09/06/16- 1030- Barbara Vitug RN, CM- pt for d/c home today with daughter- per PT eval no f/u recommendations made- pt now on RA- no need for home 02- no CM needs noted for discharge.   Darrold SpanWebster, Barbara Brocks Hall, RN 09/06/2016, 10:38 AM

## 2016-09-06 NOTE — Care Management Important Message (Signed)
Important Message  Patient Details  Name: Barbara PateeHazel Mascio MRN: [id number] Date of Birth: 1932-09-08   Medicare Important Message Given:  Yes    Kyla BalzarineShealy, Ayden Hardwick Abena 09/06/2016, 12:49 PM

## 2016-09-13 ENCOUNTER — Ambulatory Visit (HOSPITAL_COMMUNITY)
Admission: EM | Admit: 2016-09-13 | Discharge: 2016-09-13 | Disposition: A | Discharge status: Home / Self Care | Payer: Medicare Other | Attending: Family Medicine | Admitting: Family Medicine

## 2016-09-13 ENCOUNTER — Ambulatory Visit (INDEPENDENT_AMBULATORY_CARE_PROVIDER_SITE_OTHER): Payer: Medicare Other

## 2016-09-13 ENCOUNTER — Encounter (HOSPITAL_COMMUNITY): Payer: Self-pay | Admitting: *Deleted

## 2016-09-13 DIAGNOSIS — S0181XA Laceration without foreign body of other part of head, initial encounter: Secondary | ICD-10-CM

## 2016-09-13 DIAGNOSIS — S42291A Other displaced fracture of upper end of right humerus, initial encounter for closed fracture: Secondary | ICD-10-CM

## 2016-09-13 NOTE — ED Notes (Signed)
The patient had an arm sling with her. The patient's sling was adjusted and correctly applied.

## 2016-09-13 NOTE — Discharge Instructions (Signed)
Use ointment on face and sling to arm. See dr Ophelia Charteryates in couple days for arm recheck.

## 2016-09-13 NOTE — ED Provider Notes (Signed)
[facility]    CSN: [id number] Arrival date & time: 09/13/16  1732     History   Chief Complaint Chief Complaint  Patient presents with  . Fall  . Arm Injury  . Head Laceration    HPI Barbara Collins is a 83 y.o. female.   The history is provided by the patient and a relative.  Fall  This is a new problem. The current episode started yesterday (fell with brief syncopal spell last eve striking right face and right upper arm). The problem has not changed since onset.Pertinent negatives include no chest pain and no abdominal pain. The symptoms are aggravated by bending.  Arm Injury  Head Laceration  Pertinent negatives include no chest pain and no abdominal pain.    Past Medical History:  Diagnosis Date  . ALS (amyotrophic lateral sclerosis) (HCC)   . Aortic stenosis   . CAD (coronary artery disease)   . Hyperlipemia   . Hypertension   . Lumbar spinal stenosis   . Neuropathy (HCC)    Feet  . Right arm weakness     Patient Active Problem List   Diagnosis Date Noted  . CAP (community acquired pneumonia) 09/01/2016  . Acute respiratory failure with hypoxia (HCC) 09/01/2016  . ALS (amyotrophic lateral sclerosis) (HCC) 09/01/2016  . CAD (coronary artery disease) s/p CABG. 09/01/2016  . Hypertension 09/01/2016  . Weakness 11/20/2014  . Arm muscle atrophy 11/20/2014  . Muscle weakness of right arm 11/20/2014    Past Surgical History:  Procedure Laterality Date  . CARDIAC SURGERY     2005, CABG x 2  . CATARACT EXTRACTION     Bilateral  . CESAREAN SECTION    . CHOLECYSTECTOMY    . HAND SURGERY     Right    OB History    No data available       Home Medications    Prior to Admission medications   Medication Sig Start Date End Date Taking? Authorizing Provider  aspirin EC 81 MG tablet Take 81 mg by mouth daily.   Yes Historical Provider, MD  Cholecalciferol (VITAMIN D3) 2000 units capsule Take 2,000 Units by mouth daily.   Yes Historical Provider,  MD  diphenhydrAMINE (BENADRYL) 25 mg capsule Take 1 capsule (25 mg total) by mouth at bedtime as needed for sleep (give with Tylenol to make Tylenol pm). 09/06/16  Yes Alison Murray, MD  ibuprofen (ADVIL,MOTRIN) 200 MG tablet Take 200 mg by mouth every 6 (six) hours as needed.   Yes Historical Provider, MD  metoprolol (LOPRESSOR) 50 MG tablet Take 25 mg by mouth daily.    Yes Historical Provider, MD  potassium chloride (K-DUR,KLOR-CON) 10 MEQ tablet Take 10 mEq by mouth 2 (two) times daily.   Yes Historical Provider, MD  riluzole (RILUTEK) 50 MG tablet Take 1 tablet (50 mg total) by mouth every 12 (twelve) hours. 01/14/15  Yes Levert Feinstein, MD  acetaminophen (TYLENOL) 325 MG tablet Take 2 tablets (650 mg total) by mouth every 6 (six) hours as needed for mild pain (or Fever >/= 101). 09/06/16   Alison Murray, MD  guaiFENesin (ROBITUSSIN) 100 MG/5ML SOLN Take 5 mLs (100 mg total) by mouth every 4 (four) hours as needed for cough or to loosen phlegm. 09/06/16   Alison Murray, MD  levofloxacin (LEVAQUIN) 500 MG tablet Take 1 tablet (500 mg total) by mouth daily. 09/06/16   Alison Murray, MD    Family History Family History  Problem  Relation Age of Onset  . Hypertension Mother     Social History Social History  Substance Use Topics  . Smoking status: Former Games developermoker  . Smokeless tobacco: Never Used  . Alcohol use No     Comment: Rare     Allergies   Codeine   Review of Systems Review of Systems  Constitutional: Negative.   Cardiovascular: Negative for chest pain.  Gastrointestinal: Negative for abdominal pain.  Musculoskeletal: Positive for gait problem and myalgias.  Skin: Positive for wound.  All other systems reviewed and are negative.    Physical Exam Triage Vital Signs ED Triage Vitals  Enc Vitals Group     BP 09/13/16 1808 122/74     Pulse Rate 09/13/16 1808 90     Resp 09/13/16 1808 17     Temp 09/13/16 1808 98.1 F (36.7 C)     Temp Source 09/13/16 1808 Oral     SpO2 --       Weight --      Height --      Head Circumference --      Peak Flow --      Pain Score 09/13/16 1812 6     Pain Loc --      Pain Edu? --      Excl. in GC? --    No data found.   Updated Vital Signs BP 122/74 (BP Location: Left Arm)   Pulse 90   Temp 98.1 F (36.7 C) (Oral)   Resp 17   Visual Acuity Right Eye Distance:   Left Eye Distance:   Bilateral Distance:    Right Eye Near:   Left Eye Near:    Bilateral Near:     Physical Exam  Constitutional: She is oriented to person, place, and time. She appears well-developed and well-nourished.  HENT:  Right Ear: External ear normal.  Left Ear: External ear normal.  Musculoskeletal:  No ortho problem except right arm.  Neurological: She is alert and oriented to person, place, and time.  Skin: Skin is warm and dry.  Lac to right side of face and extensive pain and ecchymosis to right humerus, weakness of forearm and hand from ALS.  Nursing note and vitals reviewed.    UC Treatments / Results  Labs (all labs ordered are listed, but only abnormal results are displayed) Labs Reviewed - No data to display  EKG  EKG Interpretation None       Radiology No results found. X-rays reviewed and report per radiologist.   Procedures Procedures (including critical care time)  Medications Ordered in UC Medications - No data to display   Initial Impression / Assessment and Plan / UC Course  I have reviewed the triage vital signs and the nursing notes.  Pertinent labs & imaging results that were available during my care of the patient were reviewed by me and considered in my medical decision making (see chart for details).       Final Clinical Impressions(s) / UC Diagnoses   Final diagnoses:  Humeral head fracture, right, closed, initial encounter  Facial laceration, initial encounter    New Prescriptions Discharge Medication List as of 09/13/2016  7:21 PM       Linna HoffJames D Kindl, MD 09/28/16 2147

## 2016-09-13 NOTE — ED Triage Notes (Signed)
Patient was walking last night with cane and had questionable syncopal episode, patient with laceration to right side of face and right arm pain. Patient noted burising to right side of face and right upper arm, patient with ALS and has decreased movement to right arm, states that with any movement of right arm she has pain. States she doesn't remember falling, family states that it did not take her a long time to become and alert and oriented.

## 2016-10-26 DIAGNOSIS — G1221 Amyotrophic lateral sclerosis: Secondary | ICD-10-CM | POA: Diagnosis not present

## 2016-10-26 DIAGNOSIS — E559 Vitamin D deficiency, unspecified: Secondary | ICD-10-CM | POA: Diagnosis not present

## 2016-10-26 DIAGNOSIS — Z5181 Encounter for therapeutic drug level monitoring: Secondary | ICD-10-CM | POA: Diagnosis not present

## 2016-12-10 ENCOUNTER — Emergency Department (HOSPITAL_COMMUNITY): Payer: Medicare Other

## 2016-12-10 ENCOUNTER — Inpatient Hospital Stay (HOSPITAL_COMMUNITY)
Admission: EM | Admit: 2016-12-10 | Discharge: 2016-12-12 | DRG: 086 | Disposition: A | Discharge status: Hospice | Payer: Medicare Other | Attending: Internal Medicine | Admitting: Internal Medicine

## 2016-12-10 ENCOUNTER — Encounter (HOSPITAL_COMMUNITY): Payer: Self-pay | Admitting: Emergency Medicine

## 2016-12-10 DIAGNOSIS — S299XXA Unspecified injury of thorax, initial encounter: Secondary | ICD-10-CM | POA: Diagnosis not present

## 2016-12-10 DIAGNOSIS — R296 Repeated falls: Secondary | ICD-10-CM | POA: Diagnosis present

## 2016-12-10 DIAGNOSIS — R64 Cachexia: Secondary | ICD-10-CM | POA: Diagnosis present

## 2016-12-10 DIAGNOSIS — F0390 Unspecified dementia without behavioral disturbance: Secondary | ICD-10-CM | POA: Diagnosis present

## 2016-12-10 DIAGNOSIS — I69351 Hemiplegia and hemiparesis following cerebral infarction affecting right dominant side: Secondary | ICD-10-CM | POA: Diagnosis not present

## 2016-12-10 DIAGNOSIS — Z66 Do not resuscitate: Secondary | ICD-10-CM | POA: Diagnosis present

## 2016-12-10 DIAGNOSIS — S52591A Other fractures of lower end of right radius, initial encounter for closed fracture: Secondary | ICD-10-CM | POA: Diagnosis not present

## 2016-12-10 DIAGNOSIS — Z79899 Other long term (current) drug therapy: Secondary | ICD-10-CM

## 2016-12-10 DIAGNOSIS — S59291A Other physeal fracture of lower end of radius, right arm, initial encounter for closed fracture: Secondary | ICD-10-CM | POA: Diagnosis not present

## 2016-12-10 DIAGNOSIS — S098XXA Other specified injuries of head, initial encounter: Secondary | ICD-10-CM | POA: Diagnosis not present

## 2016-12-10 DIAGNOSIS — Z681 Body mass index (BMI) 19 or less, adult: Secondary | ICD-10-CM

## 2016-12-10 DIAGNOSIS — S0003XA Contusion of scalp, initial encounter: Secondary | ICD-10-CM | POA: Diagnosis not present

## 2016-12-10 DIAGNOSIS — I2510 Atherosclerotic heart disease of native coronary artery without angina pectoris: Secondary | ICD-10-CM | POA: Diagnosis present

## 2016-12-10 DIAGNOSIS — S61411A Laceration without foreign body of right hand, initial encounter: Secondary | ICD-10-CM | POA: Diagnosis present

## 2016-12-10 DIAGNOSIS — G1221 Amyotrophic lateral sclerosis: Secondary | ICD-10-CM | POA: Diagnosis present

## 2016-12-10 DIAGNOSIS — I10 Essential (primary) hypertension: Secondary | ICD-10-CM | POA: Diagnosis present

## 2016-12-10 DIAGNOSIS — S199XXA Unspecified injury of neck, initial encounter: Secondary | ICD-10-CM | POA: Diagnosis not present

## 2016-12-10 DIAGNOSIS — W19XXXA Unspecified fall, initial encounter: Secondary | ICD-10-CM | POA: Diagnosis present

## 2016-12-10 DIAGNOSIS — Z885 Allergy status to narcotic agent status: Secondary | ICD-10-CM

## 2016-12-10 DIAGNOSIS — Z951 Presence of aortocoronary bypass graft: Secondary | ICD-10-CM

## 2016-12-10 DIAGNOSIS — Z515 Encounter for palliative care: Secondary | ICD-10-CM | POA: Diagnosis present

## 2016-12-10 DIAGNOSIS — S069X0A Unspecified intracranial injury without loss of consciousness, initial encounter: Secondary | ICD-10-CM | POA: Diagnosis not present

## 2016-12-10 DIAGNOSIS — Z823 Family history of stroke: Secondary | ICD-10-CM

## 2016-12-10 DIAGNOSIS — S0190XA Unspecified open wound of unspecified part of head, initial encounter: Secondary | ICD-10-CM | POA: Diagnosis not present

## 2016-12-10 DIAGNOSIS — Z7982 Long term (current) use of aspirin: Secondary | ICD-10-CM

## 2016-12-10 DIAGNOSIS — Z8249 Family history of ischemic heart disease and other diseases of the circulatory system: Secondary | ICD-10-CM

## 2016-12-10 DIAGNOSIS — R55 Syncope and collapse: Secondary | ICD-10-CM | POA: Diagnosis not present

## 2016-12-10 DIAGNOSIS — Z87891 Personal history of nicotine dependence: Secondary | ICD-10-CM

## 2016-12-10 DIAGNOSIS — S06320A Contusion and laceration of left cerebrum without loss of consciousness, initial encounter: Secondary | ICD-10-CM | POA: Diagnosis not present

## 2016-12-10 DIAGNOSIS — I350 Nonrheumatic aortic (valve) stenosis: Secondary | ICD-10-CM | POA: Diagnosis present

## 2016-12-10 DIAGNOSIS — Z9049 Acquired absence of other specified parts of digestive tract: Secondary | ICD-10-CM

## 2016-12-10 DIAGNOSIS — I629 Nontraumatic intracranial hemorrhage, unspecified: Secondary | ICD-10-CM

## 2016-12-10 DIAGNOSIS — E785 Hyperlipidemia, unspecified: Secondary | ICD-10-CM | POA: Diagnosis present

## 2016-12-10 DIAGNOSIS — S6291XA Unspecified fracture of right wrist and hand, initial encounter for closed fracture: Secondary | ICD-10-CM | POA: Diagnosis present

## 2016-12-10 LAB — COMPREHENSIVE METABOLIC PANEL
ALT: 17 U/L (ref 14–54)
AST: 23 U/L (ref 15–41)
Albumin: 3.7 g/dL (ref 3.5–5.0)
Alkaline Phosphatase: 56 U/L (ref 38–126)
Anion gap: 12 (ref 5–15)
BUN: 24 mg/dL — ABNORMAL HIGH (ref 6–20)
CALCIUM: 9.8 mg/dL (ref 8.9–10.3)
CO2: 32 mmol/L (ref 22–32)
CREATININE: 0.61 mg/dL (ref 0.44–1.00)
Chloride: 98 mmol/L — ABNORMAL LOW (ref 101–111)
Glucose, Bld: 98 mg/dL (ref 65–99)
POTASSIUM: 4.2 mmol/L (ref 3.5–5.1)
Sodium: 142 mmol/L (ref 135–145)
TOTAL PROTEIN: 6.2 g/dL — AB (ref 6.5–8.1)
Total Bilirubin: 0.7 mg/dL (ref 0.3–1.2)

## 2016-12-10 LAB — CBC WITH DIFFERENTIAL/PLATELET
BASOS PCT: 0 %
Basophils Absolute: 0 10*3/uL (ref 0.0–0.1)
EOS ABS: 0.1 10*3/uL (ref 0.0–0.7)
Eosinophils Relative: 1 %
HCT: 39.1 % (ref 36.0–46.0)
HEMOGLOBIN: 12.7 g/dL (ref 12.0–15.0)
LYMPHS PCT: 19 %
Lymphs Abs: 1.2 10*3/uL (ref 0.7–4.0)
MCH: 28.6 pg (ref 26.0–34.0)
MCHC: 32.5 g/dL (ref 30.0–36.0)
MCV: 88.1 fL (ref 78.0–100.0)
Monocytes Absolute: 0.5 10*3/uL (ref 0.1–1.0)
Monocytes Relative: 8 %
NEUTROS PCT: 72 %
Neutro Abs: 4.3 10*3/uL (ref 1.7–7.7)
Platelets: 121 10*3/uL — ABNORMAL LOW (ref 150–400)
RBC: 4.44 MIL/uL (ref 3.87–5.11)
RDW: 13.6 % (ref 11.5–15.5)
WBC: 6.1 10*3/uL (ref 4.0–10.5)

## 2016-12-10 LAB — URINALYSIS, ROUTINE W REFLEX MICROSCOPIC
BILIRUBIN URINE: NEGATIVE
GLUCOSE, UA: NEGATIVE mg/dL
HGB URINE DIPSTICK: NEGATIVE
KETONES UR: 20 mg/dL — AB
Leukocytes, UA: NEGATIVE
NITRITE: NEGATIVE
PROTEIN: 30 mg/dL — AB
Specific Gravity, Urine: 1.024 (ref 1.005–1.030)
pH: 5 (ref 5.0–8.0)

## 2016-12-10 MED ORDER — FENTANYL CITRATE (PF) 100 MCG/2ML IJ SOLN
25.0000 ug | Freq: Once | INTRAMUSCULAR | Status: AC
  Administered 2016-12-10: 25 ug via INTRAVENOUS
  Filled 2016-12-10: qty 2

## 2016-12-10 MED ORDER — SODIUM CHLORIDE 0.9% FLUSH: 3.0000 mL | INTRAVENOUS | Status: DC | PRN

## 2016-12-10 MED ORDER — ONDANSETRON HCL 4 MG/2ML IJ SOLN
4.0000 mg | Freq: Four times a day (QID) | INTRAMUSCULAR | Status: DC | PRN
Start: 2016-12-10 — End: 2016-12-12

## 2016-12-10 MED ORDER — SODIUM CHLORIDE 0.9 % IV SOLN: 250.0000 mL | INTRAVENOUS | Status: DC | PRN

## 2016-12-10 MED ORDER — BIOTENE DRY MOUTH MT LIQD: 15.0000 mL | OROMUCOSAL | Status: DC | PRN

## 2016-12-10 MED ORDER — HALOPERIDOL LACTATE 5 MG/ML IJ SOLN: 0.5000 mg | INTRAMUSCULAR | Status: DC | PRN

## 2016-12-10 MED ORDER — ONDANSETRON 4 MG PO TBDP: 4.0000 mg | ORAL_TABLET | Freq: Four times a day (QID) | ORAL | Status: DC | PRN

## 2016-12-10 MED ORDER — ACETAMINOPHEN 650 MG RE SUPP: 650.0000 mg | Freq: Four times a day (QID) | RECTAL | Status: DC | PRN

## 2016-12-10 MED ORDER — POLYVINYL ALCOHOL 1.4 % OP SOLN: 1.0000 [drp] | Freq: Four times a day (QID) | OPHTHALMIC | Status: DC | PRN

## 2016-12-10 MED ORDER — SODIUM CHLORIDE 0.9% FLUSH
3.0000 mL | Freq: Two times a day (BID) | INTRAVENOUS | Status: DC
  Administered 2016-12-10 – 2016-12-11 (×2): 3 mL via INTRAVENOUS

## 2016-12-10 MED ORDER — ACETAMINOPHEN 325 MG PO TABS
650.0000 mg | ORAL_TABLET | Freq: Four times a day (QID) | ORAL | Status: DC | PRN
  Administered 2016-12-11 – 2016-12-12 (×2): 650 mg via ORAL
  Filled 2016-12-10 (×2): qty 2

## 2016-12-10 MED ORDER — ACETAMINOPHEN 500 MG PO TABS
500.0000 mg | ORAL_TABLET | Freq: Every day | ORAL | Status: DC
  Administered 2016-12-10: 500 mg via ORAL
  Administered 2016-12-11: 1000 mg via ORAL
  Filled 2016-12-10 (×2): qty 2

## 2016-12-10 MED ORDER — HALOPERIDOL LACTATE 2 MG/ML PO CONC: 0.5000 mg | ORAL | Status: DC | PRN

## 2016-12-10 MED ORDER — OXYCODONE HCL 5 MG PO TABS
5.0000 mg | ORAL_TABLET | ORAL | Status: DC | PRN
  Administered 2016-12-12 (×2): 5 mg via ORAL
  Filled 2016-12-10 (×2): qty 1

## 2016-12-10 MED ORDER — DIPHENHYDRAMINE-APAP (SLEEP) 25-500 MG PO TABS: 1.0000 | ORAL_TABLET | Freq: Every day | ORAL | Status: DC

## 2016-12-10 MED ORDER — DIPHENHYDRAMINE HCL 25 MG PO CAPS
25.0000 mg | ORAL_CAPSULE | Freq: Every day | ORAL | Status: DC
  Administered 2016-12-10 – 2016-12-11 (×2): 25 mg via ORAL
  Filled 2016-12-10 (×2): qty 1

## 2016-12-10 MED ORDER — HALOPERIDOL 1 MG PO TABS
0.5000 mg | ORAL_TABLET | ORAL | Status: DC | PRN
Start: 2016-12-10 — End: 2016-12-12

## 2016-12-10 NOTE — ED Triage Notes (Signed)
Pt BIB EMS from [employer], where pt was going to car with daughter and fell when daughter went to get car door for pt. Pt reports feeling "off balance when getting up to go to the hairdryer." Head lac/hematoma noted to rt skull; Hand tear noted to rt hand. Pt denies LOC or neck/back pain.Pupils round, equal and reeactive. Pt rt arm paralyzed at baseline.  Pt A&Ox 4; resp e/u.Daughter at bedside.

## 2016-12-10 NOTE — ED Notes (Addendum)
Patient transported to XR. 

## 2016-12-10 NOTE — ED Notes (Signed)
Dr. Lui at the bedside.  

## 2016-12-10 NOTE — ED Notes (Signed)
Pt placed on bedpan, pt reports she does not have to go but seems concerned she might void on the bed despite being continent. This nurse placed pt in a brief and positioned a bed pad underneath her for comfort.

## 2016-12-10 NOTE — ED Notes (Signed)
Admitting provider at bedside.

## 2016-12-10 NOTE — ED Notes (Addendum)
Pt wound seeping blood through gauze and bandage, this nurse re wrapped pt head wound with gauze and ace bandage. Blood cleaned off pt neck and chest. Pt C-collar no longer on, family member reports it was "bothering her so I went ahead and took it off". Explained to pt and family need for c-cpine collar but pt family reports she is fine with removing it and keeping it off.

## 2016-12-10 NOTE — ED Notes (Signed)
Pt returned from CT °

## 2016-12-10 NOTE — ED Notes (Signed)
Per main lab, results of CMP hemolyzed, will send another sample

## 2016-12-10 NOTE — ED Provider Notes (Addendum)
[facility]ER[facility]ENCY DEPT Provider Note   CSN: [id number] Arrival date & time: 12/10/16  1421     History   Chief Complaint Chief Complaint  Patient presents with  . Fall  . Head Laceration    HPI Flonnie Wierman is a 83 y.o. female.   Fall  Pertinent negatives include no chest pain, no abdominal pain, no headaches and no shortness of breath.  Head Laceration  Pertinent negatives include no chest pain, no abdominal pain, no headaches and no shortness of breath.   83 year old female who presents with fall and head injury just prior to arrival. She has a history of coronary artery disease, aortic stenosis hypertension, hyperlipidemia, and amyotrophic lateral sclerosis with residual right arm paralysis and right leg paresis. She is not anticoagulated. History is provided by patient's daughter as patient states that she does not remember what happened. Her daughter states that she does have some mild dementia and at times can be disoriented. Today was at the hairdresser and was walking out when she fell. Patient is unsure if she lost balance or tripped. There is no loss of consciousness. Her daughter did not fully witnessed fall as she was in the front opening the door for her. Saw that she had landed on her face on the ground. She denies any headache, vision or speech changes. No recent illnesses, fever or chills. No neck pain, back pain, abdominal pain, chest pain or dyspnea. Her daughter does state that the hair dresser thought maybe she was a little confused today, but she did not see this this morning.   Past Medical History:  Diagnosis Date  . ALS (amyotrophic lateral sclerosis) (HCC)   . Aortic stenosis   . CAD (coronary artery disease)   . Hyperlipemia   . Hypertension   . Lumbar spinal stenosis   . Neuropathy (HCC)    Feet  . Right arm weakness     Patient Active Problem List   Diagnosis Date Noted  . CAP (community acquired pneumonia) 09/01/2016  . Acute respiratory failure  with hypoxia (HCC) 09/01/2016  . ALS (amyotrophic lateral sclerosis) (HCC) 09/01/2016  . CAD (coronary artery disease) s/p CABG. 09/01/2016  . Hypertension 09/01/2016  . Weakness 11/20/2014  . Arm muscle atrophy 11/20/2014  . Muscle weakness of right arm 11/20/2014    Past Surgical History:  Procedure Laterality Date  . CARDIAC SURGERY     2005, CABG x 2  . CATARACT EXTRACTION     Bilateral  . CESAREAN SECTION    . CHOLECYSTECTOMY    . HAND SURGERY     Right    OB History    No data available       Home Medications    Prior to Admission medications   Medication Sig Start Date End Date Taking? Authorizing Provider  aspirin EC 81 MG tablet Take 81 mg by mouth daily after supper.    Yes Historical Provider, MD  Cholecalciferol (VITAMIN D3) 2000 units capsule Take 2,000 Units by mouth daily after supper.    Yes Historical Provider, MD  diphenhydramine-acetaminophen (TYLENOL PM) 25-500 MG TABS tablet Take 1 tablet by mouth at bedtime.   Yes Historical Provider, MD  metoprolol (LOPRESSOR) 50 MG tablet Take 25 mg by mouth daily after supper.    Yes Historical Provider, MD  potassium chloride (K-DUR,KLOR-CON) 10 MEQ tablet Take 10 mEq by mouth daily after supper.    Yes Historical Provider, MD  acetaminophen (TYLENOL) 325 MG tablet Take 2 tablets (650 mg total)  by mouth every 6 (six) hours as needed for mild pain (or Fever >/= 101). Patient not taking: Reported on 12/10/2016 09/06/16   Alison Murray, MD  diphenhydrAMINE (BENADRYL) 25 mg capsule Take 1 capsule (25 mg total) by mouth at bedtime as needed for sleep (give with Tylenol to make Tylenol pm). Patient not taking: Reported on 12/10/2016 09/06/16   Alison Murray, MD  guaiFENesin (ROBITUSSIN) 100 MG/5ML SOLN Take 5 mLs (100 mg total) by mouth every 4 (four) hours as needed for cough or to loosen phlegm. Patient not taking: Reported on 12/10/2016 09/06/16   Alison Murray, MD  riluzole (RILUTEK) 50 MG tablet Take 1 tablet (50 mg total) by  mouth every 12 (twelve) hours. Patient not taking: Reported on 12/10/2016 01/14/15   Levert Feinstein, MD    Family History Family History  Problem Relation Age of Onset  . Hypertension Mother     Social History Social History  Substance Use Topics  . Smoking status: Former Games developer  . Smokeless tobacco: Never Used  . Alcohol use No     Comment: Rare     Allergies   Codeine   Review of Systems Review of Systems  Constitutional: Negative for fever.  HENT: Negative for congestion.   Eyes: Negative for visual disturbance.  Respiratory: Negative for shortness of breath.   Cardiovascular: Negative for chest pain.  Gastrointestinal: Negative for abdominal pain.  Genitourinary: Negative for difficulty urinating.  Musculoskeletal: Negative for back pain.  Neurological: Negative for headaches.  Hematological: Does not bruise/bleed easily.  All other systems reviewed and are negative.   Physical Exam Updated Vital Signs BP (!) 143/106   Pulse 95   Temp 97.9 F (36.6 C) (Oral)   Resp 20   Ht  (1.651 m)   Wt 100 lb (45.4 kg)   SpO2 97%   BMI 16.64 kg/m   Physical Exam Physical Exam  Nursing note and vitals reviewed. Constitutional: non-toxic, and in no acute distress Head: Normocephalic and Large right forehead hematoma with laceration and active bleeding.  Mouth/Throat: Oropharynx is clear and moist.  Neck:  cervical collar in place Eyes: PERRL, EOMI Cardiovascular: Normal rate and regular rhythm.   Pulmonary/Chest: Effort normal and breath sounds normal. no chest wall tenderness Abdominal: Soft. There is no tenderness. There is no rebound and no guarding.  Musculoskeletal: No obvious deformities. Skin tears over dorsum of the right hand.  Neurological: Alert, no facial droop, fluent speech, right arm baseline paralysis, right lower extremity baseline paresis. Normal strength against gravity in LUE and LLE.  Skin: Skin is warm and dry.  Psychiatric:  Cooperative   ED Treatments / Results  Labs (all labs ordered are listed, but only abnormal results are displayed) Labs Reviewed  CBC WITH DIFFERENTIAL/PLATELET - Abnormal; Notable for the following:       Result Value   Platelets 121 (*)    All other components within normal limits  URINALYSIS, ROUTINE W REFLEX MICROSCOPIC - Abnormal; Notable for the following:    Ketones, ur 20 (*)    Protein, ur 30 (*)    Bacteria, UA RARE (*)    Squamous Epithelial / LPF 0-5 (*)    All other components within normal limits  COMPREHENSIVE METABOLIC PANEL - Abnormal; Notable for the following:    Chloride 98 (*)    BUN 24 (*)    Total Protein 6.2 (*)    All other components within normal limits    EKG  EKG  Interpretation  Date/Time:  Friday December 10 2016 14:30:43 EDT Ventricular Rate:  80 PR Interval:    QRS Duration: 87 QT Interval:  386 QTC Calculation: 446 R Axis:   -56 Text Interpretation:  Sinus rhythm Left anterior fascicular block Abnormal R-wave progression, early transition no acute changes  Confirmed by Lilly Gasser MD, Corita Allinson [phone]) on 12/10/2016 4:30:50 PM       Radiology Dg Chest 2 View  Result Date: 12/10/2016 CLINICAL DATA:  Syncope with fall EXAM: CHEST  2 VIEW COMPARISON:  Chest radiograph August 30, 2016 and chest CT September 01, 2016 FINDINGS: There is no edema or consolidation. Heart is upper normal in size with pulmonary vascularity within normal limits. No adenopathy. There is aortic atherosclerosis. Patient is status post coronary artery bypass grafting. No pneumothorax. There is an old healed fracture of the proximal right humerus. No acute fracture evident. IMPRESSION: No edema or consolidation. There is aortic atherosclerosis. No pneumothorax. Old healed fracture proximal right humerus. Electronically Signed   By: Bretta Bang III M.D.   On: 12/10/2016 16:18   Ct Head Wo Contrast  Addendum Date: 12/10/2016   ADDENDUM REPORT: 12/10/2016 17:50 ADDENDUM: Study discussed  by telephone with Dr. Annabelle Harman Elleah Hemsley on 12/10/2016 at 1744 hours. Electronically Signed   By: Odessa Fleming M.D.   On: 12/10/2016 17:50   Result Date: 12/10/2016 CLINICAL DATA:  83 year old female status post fall with scalp hematoma. EXAM: CT HEAD WITHOUT CONTRAST CT CERVICAL SPINE WITHOUT CONTRAST TECHNIQUE: Multidetector CT imaging of the head and cervical spine was performed following the standard protocol without intravenous contrast. Multiplanar CT image reconstructions of the cervical spine were also generated. COMPARISON:  Brain MRI 12/07/2014, cervical spine MRI 11/26/2014 FINDINGS: CT HEAD FINDINGS Brain: 22 mm oval hyperdense hemorrhage in the posterior inferior left temporal lobe with mild adjacent edema. No significant regional mass effect. No left side extra-axial hemorrhage identified. No intraventricular hemorrhage. Along the right frontal operculum there is trace subarachnoid hemorrhage (series 4, image 19). No other extra-axial hemorrhage identified. Cerebral volume has not significantly changed. No ventriculomegaly. Patent basilar cisterns. No midline shift. No cortical encephalomalacia. No superimposed acute cortically based infarct identified. Vascular: Calcified atherosclerosis at the skull base. Skull: Calvarium intact.  No skull fracture identified. Sinuses/Orbits: Visualized paranasal sinuses and mastoids are stable and well pneumatized. Other: Large broad-based right anterior convexity scalp hematoma with dressing material in place. Hematoma measures up to 14 mm in thickness. Underlying calvarium intact with hyperostosis frontalis. Other scalp soft tissues within normal limits. Negative orbits soft tissues. CT CERVICAL SPINE FINDINGS Alignment: Increase straightening of cervical lordosis compared to 2016. Cervicothoracic junction alignment is within normal limits. Bilateral posterior element alignment is within normal limits. Skull base and vertebrae: Visualized skull base is intact. No  atlanto-occipital dissociation. No cervical spine fracture identified. Soft tissues and spinal canal: Mild motion artifact. Calcified carotid artery atherosclerosis. Negative noncontrast neck soft tissues. Disc levels: Widespread degenerative or diffuse idiopathic skeletal hyperostosis related cervical endplate spurring. Flowing osteophytes in the upper thoracic spine. Upper cervical facet degeneration. Disc space loss at C5-C6 and C6-C7. No significant cervical spinal stenosis suspected. Upper chest: Visible upper thoracic levels appear intact. Calcified aortic atherosclerosis. Negative lung apices. IMPRESSION: 1. Coup contrecoup brain injury. Left posterior temporal lobe hemorrhagic contusion measuring 22 mm with mild edema, and trace right frontal operculum subarachnoid hemorrhage. No intracranial mass effect. 2. Large right frontal convexity scalp hematoma without underlying fracture. 3.  No acute fracture or listhesis in the cervical spine. 4.  Calcified aortic atherosclerosis. Electronically Signed: By: Odessa Fleming M.D. On: 12/10/2016 17:40   Ct Cervical Spine Wo Contrast  Addendum Date: 12/10/2016   ADDENDUM REPORT: 12/10/2016 17:50 ADDENDUM: Study discussed by telephone with Dr. Annabelle Harman Kotaro Buer on 12/10/2016 at 1744 hours. Electronically Signed   By: Odessa Fleming M.D.   On: 12/10/2016 17:50   Result Date: 12/10/2016 CLINICAL DATA:  83 year old female status post fall with scalp hematoma. EXAM: CT HEAD WITHOUT CONTRAST CT CERVICAL SPINE WITHOUT CONTRAST TECHNIQUE: Multidetector CT imaging of the head and cervical spine was performed following the standard protocol without intravenous contrast. Multiplanar CT image reconstructions of the cervical spine were also generated. COMPARISON:  Brain MRI 12/07/2014, cervical spine MRI 11/26/2014 FINDINGS: CT HEAD FINDINGS Brain: 22 mm oval hyperdense hemorrhage in the posterior inferior left temporal lobe with mild adjacent edema. No significant regional mass effect. No left side  extra-axial hemorrhage identified. No intraventricular hemorrhage. Along the right frontal operculum there is trace subarachnoid hemorrhage (series 4, image 19). No other extra-axial hemorrhage identified. Cerebral volume has not significantly changed. No ventriculomegaly. Patent basilar cisterns. No midline shift. No cortical encephalomalacia. No superimposed acute cortically based infarct identified. Vascular: Calcified atherosclerosis at the skull base. Skull: Calvarium intact.  No skull fracture identified. Sinuses/Orbits: Visualized paranasal sinuses and mastoids are stable and well pneumatized. Other: Large broad-based right anterior convexity scalp hematoma with dressing material in place. Hematoma measures up to 14 mm in thickness. Underlying calvarium intact with hyperostosis frontalis. Other scalp soft tissues within normal limits. Negative orbits soft tissues. CT CERVICAL SPINE FINDINGS Alignment: Increase straightening of cervical lordosis compared to 2016. Cervicothoracic junction alignment is within normal limits. Bilateral posterior element alignment is within normal limits. Skull base and vertebrae: Visualized skull base is intact. No atlanto-occipital dissociation. No cervical spine fracture identified. Soft tissues and spinal canal: Mild motion artifact. Calcified carotid artery atherosclerosis. Negative noncontrast neck soft tissues. Disc levels: Widespread degenerative or diffuse idiopathic skeletal hyperostosis related cervical endplate spurring. Flowing osteophytes in the upper thoracic spine. Upper cervical facet degeneration. Disc space loss at C5-C6 and C6-C7. No significant cervical spinal stenosis suspected. Upper chest: Visible upper thoracic levels appear intact. Calcified aortic atherosclerosis. Negative lung apices. IMPRESSION: 1. Coup contrecoup brain injury. Left posterior temporal lobe hemorrhagic contusion measuring 22 mm with mild edema, and trace right frontal operculum  subarachnoid hemorrhage. No intracranial mass effect. 2. Large right frontal convexity scalp hematoma without underlying fracture. 3.  No acute fracture or listhesis in the cervical spine. 4. Calcified aortic atherosclerosis. Electronically Signed: By: Odessa Fleming M.D. On: 12/10/2016 17:40   Dg Hand Complete Right  Result Date: 12/10/2016 CLINICAL DATA:  Pain following fall EXAM: RIGHT HAND - COMPLETE 3+ VIEW COMPARISON:  None. FINDINGS: Frontal, oblique, and lateral views were obtained. There is a transverse fracture of the distal radial metaphysis with alignment essentially anatomic. No other acute fracture. No dislocation. Bones are diffusely osteoporotic. There is osteoarthritic change in all PIP and DIP joints, most marked in the first IP and second DIP joints. There is no erosive change or bony destruction. IMPRESSION: Transversely oriented fracture distal radial metaphysis with alignment essentially anatomic. No other fractures. No dislocation. Several areas of osteoarthritic change in distal joints. Bones diffusely osteoporotic. Electronically Signed   By: Bretta Bang III M.D.   On: 12/10/2016 16:20    Procedures Procedures (including critical care time) CRITICAL CARE Performed by: Lavera Guise   Total critical care time: 35 minutes  Critical care time  was exclusive of separately billable procedures and treating other patients.  Critical care was necessary to treat or prevent imminent or life-threatening deterioration.  Critical care was time spent personally by me on the following activities: development of treatment plan with patient and/or surrogate as well as nursing, discussions with consultants, evaluation of patient's response to treatment, examination of patient, obtaining history from patient or surrogate, ordering and performing treatments and interventions, ordering and review of laboratory studies, ordering and review of radiographic studies, pulse oximetry and re-evaluation  of patient's condition.   SPLINT APPLICATION Date/Time: 8:05 PM Authorized by: Lavera Guise Consent: Verbal consent obtained. Risks and benefits: risks, benefits and alternatives were discussed Consent given by: patient Splint applied by: orthopedic technician Location details: right forearm Splint type: sugar-tong short arm Supplies used: orthoglass Post-procedure: The splinted body part was neurovascularly unchanged following the procedure. Patient tolerance: Patient tolerated the procedure well with no immediate complications.    Medications Ordered in ED Medications  fentaNYL (SUBLIMAZE) injection 25 mcg (25 mcg Intravenous Given 12/10/16 1758)     Initial Impression / Assessment and Plan / ED Course  I have reviewed the triage vital signs and the nursing notes.  Pertinent labs & imaging results that were available during my care of the patient were reviewed by me and considered in my medical decision making (see chart for details).     History of ALS with right side weakness presenting after fall. Vitals stable. Large hematoma over right forehead, bleeding controlled w/ pressure. Bleeding from abrasion, no clear laceration to repair. Baseline weakness on right side, but with some difficulty remembering what happened and repetitive questioning noted. CT head visualized and w/ coup contrecoup injury. SAH over right frontal lobe and intracranial hematoma of the left temporal lobe. Discussed with Dr. Dutch Quint. Currently not requiring intervention. However, patient's daughter state that they would not want intervention if bleeding got worse. Given her post-concussive symptoms due to injury, recommending observation on medical service.  With distal right radial fracture, nondisplaced from fall as well. NV in tact aside from baseline paralysis. Will place in short arm splint. Likely non-operative given paralysis. Outpatient ortho follow-up.   Plan to admit to medical service for  observation.  Final Clinical Impressions(s) / ED Diagnoses   Final diagnoses:  Fall  Other closed fracture of distal end of right radius, initial encounter  Intracranial hemorrhage [facility])    New Prescriptions New Prescriptions   No medications on file     Lavera Guise, MD 12/10/16 1837    Lavera Guise, MD 12/10/16 Paulo Fruit    Lavera Guise, MD 12/10/16 2005

## 2016-12-10 NOTE — Progress Notes (Addendum)
Patient arrived around 2100 alert and oriented skin tear on right arm and bleeding hematoma on right side of forehead, she has history of ALS and is a DNR, daughter in room updated patient and family on safety protocol, will continue to monitor.  Alternate Phone number for daughter  [phone]

## 2016-12-10 NOTE — ED Notes (Signed)
Pt with repetitive speech, per pt family pt also seems to be having trouble "finding the right words". Pt remains alert and oriented.

## 2016-12-10 NOTE — ED Notes (Signed)
EDP at bedside  

## 2016-12-10 NOTE — Progress Notes (Signed)
Orthopedic Tech Progress Note Patient Details:  Barbara Collins 11-19-1932 [id number]  Ortho Devices Type of Ortho Device: Short arm splint Ortho Device/Splint Location: Applied Short arm splint to pt right arm.   pt tolerated well.  Provided Arm sling for support for right arm.  (Family at bedside.)  Ortho Device/Splint Interventions: Application, Adjustment   Alvina Chou 12/10/2016, 7:08 PM

## 2016-12-10 NOTE — H&P (Signed)
Barbara Collins ZOX:[id number] DOB: 1933/02/15 DOA: 12/10/2016     PCP: PROVIDER NOT IN SYSTEM   Outpatient Specialists: Neurology ALS clinic Patient coming from:   home Lives   With family 24 h care    Chief Complaint: fall   HPI: Barbara Collins is a 83 y.o. 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 Patient felt unbalanced when she stood up and fell down right hand skin tear. Denied LOC patient has right-sided paralysis at baseline secondary to history of stroke and ALS for was not truly weakness by daughter who was opening a door for patient. He will actually landed on her face. Denies any recent fevers or chills no chest pain no abdominal pain or dyspnea patient was slightly confused today but that's her normal.  She has had multiple falls with fractures in the past   Regarding pertinent Chronic problems:   coronary artery disease status post CABG in 2005 on aspirin Not on anticoagulation at baseline. Hx of ALS resulting in right side weakness  IN ER:  Temp (24hrs), Avg:97.9 F (36.6 C), Min:97.9 F (36.6 C), Max:97.9 F (36.6 C)      RR 24 blood pressure   112/72 HR 82 Sodium 142 potassium 4.2 BUN 24 creatinine 0.61 WBC 6.1 HG 12.7 plt 121  CT head cooper and contrecoup brain injury left posterior temporal lobe hemorrhagic contusion and trace right frontal subarachnoid hemorrhage Following Medications were ordered in ER: Medications  fentaNYL (SUBLIMAZE) injection 25 mcg (25 mcg Intravenous Given 12/10/16 1758)     ER provider discussed case with:  Dr. Dutch Quint with neurosurgery who felt the patient not a surgical candidate but at recommends internal medicine to admit for observation  Hospitalist was called for admission for intracranial bleeding secondary to fall while on aspirin  Review of Systems:    Pertinent positives include: fall, confusion  Constitutional:  No weight loss, night sweats, Fevers, chills, fatigue, weight loss  HEENT:  No headaches, Difficulty swallowing,Tooth/dental problems,Sore throat,  No sneezing, itching, ear ache, nasal congestion, post nasal drip,  Cardio-vascular:  No chest pain, Orthopnea, PND, anasarca, dizziness, palpitations.no Bilateral lower extremity swelling  GI:  No heartburn, indigestion, abdominal pain, nausea, vomiting, diarrhea, change in bowel habits, loss of appetite, melena, blood in stool, hematemesis Resp:  no shortness of breath at rest. No dyspnea on exertion, No excess mucus, no productive cough, No non-productive cough, No coughing up of blood.No change in color of mucus.No wheezing. Skin:  no rash or lesions. No jaundice GU:  no dysuria, change in color of urine, no urgency or frequency. No straining to urinate.  No flank pain.  Musculoskeletal:  No joint pain or no joint swelling. No decreased range of motion. No back pain.  Psych:  No change in mood or affect. No depression or anxiety. No memory loss.  Neuro: no localizing neurological complaints, no tingling, no weakness, no double vision, no gait abnormality, no slurred speech, no  As per HPI otherwise 10 point review of systems negative.   Past Medical History: Past Medical History:  Diagnosis Date  . ALS (amyotrophic lateral sclerosis) (HCC)   . Aortic stenosis   . CAD (coronary artery disease)   . Hyperlipemia   . Hypertension   . Lumbar spinal stenosis   . Neuropathy (HCC)    Feet  . Right arm  weakness    Past Surgical History:  Procedure Laterality Date  . CARDIAC SURGERY     2005, CABG x 2  . CATARACT EXTRACTION     Bilateral  . CESAREAN SECTION    . CHOLECYSTECTOMY    . HAND SURGERY     Right     Social History:     reports that she has quit smoking. She has never used smokeless tobacco. She reports that she does not drink alcohol or use  drugs.  Allergies:   Allergies  Allergen Reactions  . Codeine Shortness Of Breath and Nausea Only       Family History:   Family History  Problem Relation Age of Onset  . Hypertension Mother     Medications: Prior to Admission medications   Medication Sig Start Date End Date Taking? Authorizing Provider  aspirin EC 81 MG tablet Take 81 mg by mouth daily after supper.    Yes Historical Provider, MD  Cholecalciferol (VITAMIN D3) 2000 units capsule Take 2,000 Units by mouth daily after supper.    Yes Historical Provider, MD  diphenhydramine-acetaminophen (TYLENOL PM) 25-500 MG TABS tablet Take 1 tablet by mouth at bedtime.   Yes Historical Provider, MD  metoprolol (LOPRESSOR) 50 MG tablet Take 25 mg by mouth daily after supper.    Yes Historical Provider, MD  potassium chloride (K-DUR,KLOR-CON) 10 MEQ tablet Take 10 mEq by mouth daily after supper.    Yes Historical Provider, MD  acetaminophen (TYLENOL) 325 MG tablet Take 2 tablets (650 mg total) by mouth every 6 (six) hours as needed for mild pain (or Fever >/= 101). Patient not taking: Reported on 12/10/2016 09/06/16   Alison Murray, MD  diphenhydrAMINE (BENADRYL) 25 mg capsule Take 1 capsule (25 mg total) by mouth at bedtime as needed for sleep (give with Tylenol to make Tylenol pm). Patient not taking: Reported on 12/10/2016 09/06/16   Alison Murray, MD  guaiFENesin (ROBITUSSIN) 100 MG/5ML SOLN Take 5 mLs (100 mg total) by mouth every 4 (four) hours as needed for cough or to loosen phlegm. Patient not taking: Reported on 12/10/2016 09/06/16   Alison Murray, MD  riluzole (RILUTEK) 50 MG tablet Take 1 tablet (50 mg total) by mouth every 12 (twelve) hours. Patient not taking: Reported on 12/10/2016 01/14/15   Levert Feinstein, MD    Physical Exam: Patient Vitals for the past 24 hrs:  BP Temp Temp src Pulse Resp SpO2 Height Weight  12/10/16 1845 115/65 - - 93 (!) 24 100 % - -  12/10/16 1830 105/84 - - 100 (!) 30 96 % - -  12/10/16 1815 (!) 128/58  - - 85 19 94 % - -  12/10/16 1808 (!) 143/106 - - 95 20 97 % - -  12/10/16 1733 - - - 90 - 98 % - -  12/10/16 1730 (!) 120/40 - - - - - - -  12/10/16 1630 (!) 141/78 - - 85 - 98 % - -  12/10/16 1615 (!) 147/60 - - 82 - 97 % - -  12/10/16 1600 (!) 154/78 - - 82 - 100 % - -  12/10/16 1553 (!) 149/74 - - 85 20 100 % - -  12/10/16 1545 (!) 149/74 - - 88 - 96 % - -  12/10/16 1530 (!) 142/95 - - 77 - 97 % - -  12/10/16 1500 (!) 158/78 - - 81 (!) 24 99 % - -  12/10/16 1429 - 97.9 F (36.6 C)  Oral - - -  (1.651 m) 45.4 kg (100 lb)    1. General:  in No Acute distress 2. Psychological: Alert  But not fully Oriented 3. Head/ENT:     Dry Mucous Membranes                          Head  Traumatic frontal hematoma, neck supple                            Poor Dentition 4. SKIN:   decreased Skin turgor,  Skin clean Dry and intact no rash 5. Heart: Regular rate and rhythm systolic  Murmur, Rub or gallop 6. Lungs: no wheezes or crackles   7. Abdomen: Soft,  non-tender, Non distended 8. Lower extremities: no clubbing, cyanosis, or edema 9. Neurologically right hemiparesis cranial nerves II through XII intact 10. MSK: Normal range of motion   body mass index is 16.64 kg/m.  Labs on Admission:   Labs on Admission: I have personally reviewed following labs and imaging studies  CBC:  Recent Labs Lab 12/10/16 1511  WBC 6.1  NEUTROABS 4.3  HGB 12.7  HCT 39.1  MCV 88.1  PLT 121*   Basic Metabolic Panel:  Recent Labs Lab 12/10/16 1632  NA 142  K 4.2  CL 98*  CO2 32  GLUCOSE 98  BUN 24*  CREATININE 0.61  CALCIUM 9.8   GFR: Estimated Creatinine Clearance: 39.5 mL/min (by C-G formula based on SCr of 0.61 mg/dL). Liver Function Tests:  Recent Labs Lab 12/10/16 1632  AST 23  ALT 17  ALKPHOS 56  BILITOT 0.7  PROT 6.2*  ALBUMIN 3.7   No results for input(s): LIPASE, AMYLASE in the last 168 hours. No results for input(s): AMMONIA in the last 168 hours. Coagulation  Profile: No results for input(s): INR, PROTIME in the last 168 hours. Cardiac Enzymes: No results for input(s): CKTOTAL, CKMB, CKMBINDEX, TROPONINI in the last 168 hours. BNP (last 3 results) No results for input(s): PROBNP in the last 8760 hours. HbA1C: No results for input(s): HGBA1C in the last 72 hours. CBG: No results for input(s): GLUCAP in the last 168 hours. Lipid Profile: No results for input(s): CHOL, HDL, LDLCALC, TRIG, CHOLHDL, LDLDIRECT in the last 72 hours. Thyroid Function Tests: No results for input(s): TSH, T4TOTAL, FREET4, T3FREE, THYROIDAB in the last 72 hours. Anemia Panel: No results for input(s): VITAMINB12, FOLATE, FERRITIN, TIBC, IRON, RETICCTPCT in the last 72 hours. Urine analysis:    Component Value Date/Time   COLORURINE YELLOW 12/10/2016 1655   APPEARANCEUR CLEAR 12/10/2016 1655   LABSPEC 1.024 12/10/2016 1655   PHURINE 5.0 12/10/2016 1655   GLUCOSEU NEGATIVE 12/10/2016 1655   HGBUR NEGATIVE 12/10/2016 1655   BILIRUBINUR NEGATIVE 12/10/2016 1655   KETONESUR 20 (A) 12/10/2016 1655   PROTEINUR 30 (A) 12/10/2016 1655   NITRITE NEGATIVE 12/10/2016 1655   LEUKOCYTESUR NEGATIVE 12/10/2016 1655   Sepsis Labs: (procalcitonin:4,lacticidven:4) )No results found for this or any previous visit (from the past 240 hour(s)).     UA  no evidence of UTI     No results found for: HGBA1C  Estimated Creatinine Clearance: 39.5 mL/min (by C-G formula based on SCr of 0.61 mg/dL).  BNP (last 3 results) No results for input(s): PROBNP in the last 8760 hours.   ECG REPORT  Independently reviewed Rate: 80  Rhythm: Left anterior fascicular block ST&T Change: No acute ischemic  changes   QTC 446  Filed Weights   12/10/16 1429  Weight: 45.4 kg (100 lb)     Cultures: No results found for: SDES, SPECREQUEST, CULT, REPTSTATUS   Radiological Exams on Admission: Dg Chest 2 View  Result Date: 12/10/2016 CLINICAL DATA:  Syncope with fall EXAM: CHEST  2  VIEW COMPARISON:  Chest radiograph August 30, 2016 and chest CT September 01, 2016 FINDINGS: There is no edema or consolidation. Heart is upper normal in size with pulmonary vascularity within normal limits. No adenopathy. There is aortic atherosclerosis. Patient is status post coronary artery bypass grafting. No pneumothorax. There is an old healed fracture of the proximal right humerus. No acute fracture evident. IMPRESSION: No edema or consolidation. There is aortic atherosclerosis. No pneumothorax. Old healed fracture proximal right humerus. Electronically Signed   By: Bretta Bang III M.D.   On: 12/10/2016 16:18   Ct Head Wo Contrast  Addendum Date: 12/10/2016   ADDENDUM REPORT: 12/10/2016 17:50 ADDENDUM: Study discussed by telephone with Dr. Annabelle Harman LIU on 12/10/2016 at 1744 hours. Electronically Signed   By: Odessa Fleming M.D.   On: 12/10/2016 17:50   Result Date: 12/10/2016 CLINICAL DATA:  83 year old female status post fall with scalp hematoma. EXAM: CT HEAD WITHOUT CONTRAST CT CERVICAL SPINE WITHOUT CONTRAST TECHNIQUE: Multidetector CT imaging of the head and cervical spine was performed following the standard protocol without intravenous contrast. Multiplanar CT image reconstructions of the cervical spine were also generated. COMPARISON:  Brain MRI 12/07/2014, cervical spine MRI 11/26/2014 FINDINGS: CT HEAD FINDINGS Brain: 22 mm oval hyperdense hemorrhage in the posterior inferior left temporal lobe with mild adjacent edema. No significant regional mass effect. No left side extra-axial hemorrhage identified. No intraventricular hemorrhage. Along the right frontal operculum there is trace subarachnoid hemorrhage (series 4, image 19). No other extra-axial hemorrhage identified. Cerebral volume has not significantly changed. No ventriculomegaly. Patent basilar cisterns. No midline shift. No cortical encephalomalacia. No superimposed acute cortically based infarct identified. Vascular: Calcified atherosclerosis  at the skull base. Skull: Calvarium intact.  No skull fracture identified. Sinuses/Orbits: Visualized paranasal sinuses and mastoids are stable and well pneumatized. Other: Large broad-based right anterior convexity scalp hematoma with dressing material in place. Hematoma measures up to 14 mm in thickness. Underlying calvarium intact with hyperostosis frontalis. Other scalp soft tissues within normal limits. Negative orbits soft tissues. CT CERVICAL SPINE FINDINGS Alignment: Increase straightening of cervical lordosis compared to 2016. Cervicothoracic junction alignment is within normal limits. Bilateral posterior element alignment is within normal limits. Skull base and vertebrae: Visualized skull base is intact. No atlanto-occipital dissociation. No cervical spine fracture identified. Soft tissues and spinal canal: Mild motion artifact. Calcified carotid artery atherosclerosis. Negative noncontrast neck soft tissues. Disc levels: Widespread degenerative or diffuse idiopathic skeletal hyperostosis related cervical endplate spurring. Flowing osteophytes in the upper thoracic spine. Upper cervical facet degeneration. Disc space loss at C5-C6 and C6-C7. No significant cervical spinal stenosis suspected. Upper chest: Visible upper thoracic levels appear intact. Calcified aortic atherosclerosis. Negative lung apices. IMPRESSION: 1. Coup contrecoup brain injury. Left posterior temporal lobe hemorrhagic contusion measuring 22 mm with mild edema, and trace right frontal operculum subarachnoid hemorrhage. No intracranial mass effect. 2. Large right frontal convexity scalp hematoma without underlying fracture. 3.  No acute fracture or listhesis in the cervical spine. 4. Calcified aortic atherosclerosis. Electronically Signed: By: Odessa Fleming M.D. On: 12/10/2016 17:40   Ct Cervical Spine Wo Contrast  Addendum Date: 12/10/2016   ADDENDUM REPORT: 12/10/2016 17:50 ADDENDUM: Sarita Bottom  discussed by telephone with Dr. Crista Curb on  12/10/2016 at 1744 hours. Electronically Signed   By: Odessa Fleming M.D.   On: 12/10/2016 17:50   Result Date: 12/10/2016 CLINICAL DATA:  83 year old female status post fall with scalp hematoma. EXAM: CT HEAD WITHOUT CONTRAST CT CERVICAL SPINE WITHOUT CONTRAST TECHNIQUE: Multidetector CT imaging of the head and cervical spine was performed following the standard protocol without intravenous contrast. Multiplanar CT image reconstructions of the cervical spine were also generated. COMPARISON:  Brain MRI 12/07/2014, cervical spine MRI 11/26/2014 FINDINGS: CT HEAD FINDINGS Brain: 22 mm oval hyperdense hemorrhage in the posterior inferior left temporal lobe with mild adjacent edema. No significant regional mass effect. No left side extra-axial hemorrhage identified. No intraventricular hemorrhage. Along the right frontal operculum there is trace subarachnoid hemorrhage (series 4, image 19). No other extra-axial hemorrhage identified. Cerebral volume has not significantly changed. No ventriculomegaly. Patent basilar cisterns. No midline shift. No cortical encephalomalacia. No superimposed acute cortically based infarct identified. Vascular: Calcified atherosclerosis at the skull base. Skull: Calvarium intact.  No skull fracture identified. Sinuses/Orbits: Visualized paranasal sinuses and mastoids are stable and well pneumatized. Other: Large broad-based right anterior convexity scalp hematoma with dressing material in place. Hematoma measures up to 14 mm in thickness. Underlying calvarium intact with hyperostosis frontalis. Other scalp soft tissues within normal limits. Negative orbits soft tissues. CT CERVICAL SPINE FINDINGS Alignment: Increase straightening of cervical lordosis compared to 2016. Cervicothoracic junction alignment is within normal limits. Bilateral posterior element alignment is within normal limits. Skull base and vertebrae: Visualized skull base is intact. No atlanto-occipital dissociation. No cervical  spine fracture identified. Soft tissues and spinal canal: Mild motion artifact. Calcified carotid artery atherosclerosis. Negative noncontrast neck soft tissues. Disc levels: Widespread degenerative or diffuse idiopathic skeletal hyperostosis related cervical endplate spurring. Flowing osteophytes in the upper thoracic spine. Upper cervical facet degeneration. Disc space loss at C5-C6 and C6-C7. No significant cervical spinal stenosis suspected. Upper chest: Visible upper thoracic levels appear intact. Calcified aortic atherosclerosis. Negative lung apices. IMPRESSION: 1. Coup contrecoup brain injury. Left posterior temporal lobe hemorrhagic contusion measuring 22 mm with mild edema, and trace right frontal operculum subarachnoid hemorrhage. No intracranial mass effect. 2. Large right frontal convexity scalp hematoma without underlying fracture. 3.  No acute fracture or listhesis in the cervical spine. 4. Calcified aortic atherosclerosis. Electronically Signed: By: Odessa Fleming M.D. On: 12/10/2016 17:40   Dg Hand Complete Right  Result Date: 12/10/2016 CLINICAL DATA:  Pain following fall EXAM: RIGHT HAND - COMPLETE 3+ VIEW COMPARISON:  None. FINDINGS: Frontal, oblique, and lateral views were obtained. There is a transverse fracture of the distal radial metaphysis with alignment essentially anatomic. No other acute fracture. No dislocation. Bones are diffusely osteoporotic. There is osteoarthritic change in all PIP and DIP joints, most marked in the first IP and second DIP joints. There is no erosive change or bony destruction. IMPRESSION: Transversely oriented fracture distal radial metaphysis with alignment essentially anatomic. No other fractures. No dislocation. Several areas of osteoarthritic change in distal joints. Bones diffusely osteoporotic. Electronically Signed   By: Bretta Bang III M.D.   On: 12/10/2016 16:20    Chart has been reviewed    Assessment/Plan  83 y.o. female with medical history  significant of falls, ALS,  coronary artery disease, aortic stenosis hypertension, hyperlipidemia,   mild dementia admitted for intracranial bleeding secondary to fall while on aspirin family does not wish for any surgical interventions would like comfort care only  Present on Admission: Intracranial bleeding - family at this point would like comfort care only. Will observe over night with plan to DC to home if patient is stable. Per family request NO additional testing, limit vitals . ALS (amyotrophic lateral sclerosis) (HCC) - chronic resulting in right side hemiparesis and frequent falls . CAD (coronary artery disease) s/p CABG. Stable given intracranial bleeding will hold of on Aspirin  . Hypertension - soft BP hold metoprolol . Right hand fracture - will need to follow up with [facility] orthopedics as an outpatient    Other plan as per orders.  DVT prophylaxis:  SCD      Code Status:  DNR/DNI  as per family comfort care only  Family Communication:   Family  at  Bedside  plan of care was discussed with   Daughter  Disposition Plan:    To home once workup is complete and patient is stable                        Would benefit from PT  eval prior to DC   ordered                     Palliative care   consulted                          Consults called: Neurosurgery aware  Admission status:   obs   Level of care    medical floor          I have spent a total of 54 min on this admission    Motty Borin 12/10/2016, 8:04 PM    [facility]  Pager [phone]   after 2 AM please page floor coverage PA If 7AM-7PM, please contact the day team taking care of the patient  [email or web address]  Password [email or web address]

## 2016-12-10 NOTE — ED Notes (Signed)
Ortho tech paged regarding pt splint

## 2016-12-11 ENCOUNTER — Observation Stay (HOSPITAL_COMMUNITY): Payer: Medicare Other

## 2016-12-11 DIAGNOSIS — R296 Repeated falls: Secondary | ICD-10-CM | POA: Diagnosis present

## 2016-12-11 DIAGNOSIS — I69351 Hemiplegia and hemiparesis following cerebral infarction affecting right dominant side: Secondary | ICD-10-CM | POA: Diagnosis not present

## 2016-12-11 DIAGNOSIS — I350 Nonrheumatic aortic (valve) stenosis: Secondary | ICD-10-CM | POA: Diagnosis present

## 2016-12-11 DIAGNOSIS — R64 Cachexia: Secondary | ICD-10-CM | POA: Diagnosis present

## 2016-12-11 DIAGNOSIS — I2510 Atherosclerotic heart disease of native coronary artery without angina pectoris: Secondary | ICD-10-CM | POA: Diagnosis not present

## 2016-12-11 DIAGNOSIS — Z66 Do not resuscitate: Secondary | ICD-10-CM | POA: Diagnosis present

## 2016-12-11 DIAGNOSIS — I10 Essential (primary) hypertension: Secondary | ICD-10-CM | POA: Diagnosis present

## 2016-12-11 DIAGNOSIS — Z8249 Family history of ischemic heart disease and other diseases of the circulatory system: Secondary | ICD-10-CM | POA: Diagnosis not present

## 2016-12-11 DIAGNOSIS — Z9049 Acquired absence of other specified parts of digestive tract: Secondary | ICD-10-CM | POA: Diagnosis not present

## 2016-12-11 DIAGNOSIS — Z515 Encounter for palliative care: Secondary | ICD-10-CM | POA: Diagnosis not present

## 2016-12-11 DIAGNOSIS — F0390 Unspecified dementia without behavioral disturbance: Secondary | ICD-10-CM | POA: Diagnosis present

## 2016-12-11 DIAGNOSIS — G1221 Amyotrophic lateral sclerosis: Secondary | ICD-10-CM | POA: Diagnosis not present

## 2016-12-11 DIAGNOSIS — I629 Nontraumatic intracranial hemorrhage, unspecified: Secondary | ICD-10-CM | POA: Diagnosis not present

## 2016-12-11 DIAGNOSIS — S61411A Laceration without foreign body of right hand, initial encounter: Secondary | ICD-10-CM | POA: Diagnosis present

## 2016-12-11 DIAGNOSIS — Z823 Family history of stroke: Secondary | ICD-10-CM | POA: Diagnosis not present

## 2016-12-11 DIAGNOSIS — Z681 Body mass index (BMI) 19 or less, adult: Secondary | ICD-10-CM | POA: Diagnosis not present

## 2016-12-11 DIAGNOSIS — S52591A Other fractures of lower end of right radius, initial encounter for closed fracture: Secondary | ICD-10-CM | POA: Diagnosis present

## 2016-12-11 DIAGNOSIS — Z7982 Long term (current) use of aspirin: Secondary | ICD-10-CM | POA: Diagnosis not present

## 2016-12-11 DIAGNOSIS — Z79899 Other long term (current) drug therapy: Secondary | ICD-10-CM | POA: Diagnosis not present

## 2016-12-11 DIAGNOSIS — Z885 Allergy status to narcotic agent status: Secondary | ICD-10-CM | POA: Diagnosis not present

## 2016-12-11 DIAGNOSIS — Z951 Presence of aortocoronary bypass graft: Secondary | ICD-10-CM | POA: Diagnosis not present

## 2016-12-11 DIAGNOSIS — E785 Hyperlipidemia, unspecified: Secondary | ICD-10-CM | POA: Diagnosis present

## 2016-12-11 DIAGNOSIS — S06320A Contusion and laceration of left cerebrum without loss of consciousness, initial encounter: Secondary | ICD-10-CM | POA: Diagnosis present

## 2016-12-11 DIAGNOSIS — Z87891 Personal history of nicotine dependence: Secondary | ICD-10-CM | POA: Diagnosis not present

## 2016-12-11 DIAGNOSIS — W19XXXA Unspecified fall, initial encounter: Secondary | ICD-10-CM | POA: Diagnosis present

## 2016-12-11 LAB — APTT: APTT: 22 s — AB (ref 24–36)

## 2016-12-11 LAB — PROTIME-INR
INR: 0.96
PROTHROMBIN TIME: 12.7 s (ref 11.4–15.2)

## 2016-12-11 NOTE — Progress Notes (Signed)
Patients BP was taken with dynamap which appears to be in error after taking a manual BP the patient BP is not of concern at this time see flow sheet for BP reading at 0210 4/14 for manual reading.

## 2016-12-11 NOTE — Progress Notes (Signed)
MD called daughter per S.Bullard(NP) from palliative care. S.Bullard(NP) from palliative care is in the room with patient and daughter. Will continue to monitor.

## 2016-12-11 NOTE — Progress Notes (Signed)
Patient ID: Barbara Collins, female   DOB: Sep 06, 1932, 83 y.o.   MRN: [id number]                                                                PROGRESS NOTE                                                                                                                                                                                                             Patient Demographics:    Barbara Collins, is a 83 y.o. female, DOB - 06/01/1933, WUJ:[id number]  Admit date - 12/10/2016   Admitting Physician Therisa Doyne, MD  Outpatient Primary MD for the patient is PROVIDER NOT IN SYSTEM  LOS - 0  Outpatient Specialists:     Chief Complaint  Patient presents with  . Fall  . Head Laceration       Brief Narrative     83 y.o. female with medical history significant of falls, ALS,  coronary artery disease, aortic stenosishypertension, hyperlipidemia,    mild dementia    Presented with spell and head trauma today while at [employer]. Patient felt unbalanced when she stood up and fell down right hand skin tear. Denied LOC patient has right-sided paralysis at baseline secondary to history of stroke and ALS for was not truly weakness by daughter who was opening a door for patient. He will actually landed on her face. Denies any recent fevers or chills no chest pain no abdominal pain or dyspnea patient was slightly confused today but that's her normal.  She has had multiple falls with fractures in the past   Regarding pertinent Chronic problems:   coronary artery disease status post CABG in 2005 on aspirin Not on anticoagulation at baseline. Hx of ALS resulting in right side weakness  IN ER:  Temp (24hrs), Avg:97.9 F (36.6 C), Min:97.9 F (36.6 C), Max:97.9 F (36.6 C)      RR 24 blood pressure   112/72 HR 82 Sodium 142 potassium 4.2 BUN 24 creatinine 0.61 WBC 6.1 HG 12.7 plt 121  CT head cooper and contrecoup brain injury left posterior temporal lobe hemorrhagic contusion and trace right  frontal subarachnoid hemorrhage Following Medications were ordered in ER: Medications  fentaNYL (SUBLIMAZE) injection 25 mcg (25 mcg Intravenous Given 12/10/16 1758)  ER provider discussed case with:  Dr. Dutch Quint with neurosurgery who felt the patient not a surgical candidate but at recommends internal medicine to admit for observation  Hospitalist was called for admission for intracranial bleeding secondary to fall while on aspirin     Subjective:    Barbara Collins today has, No headache, No chest pain, No abdominal pain - No Nausea, No new weakness tingling or numbness, No Cough - SOB.     Assessment  & Plan :    Active Problems:   ALS (amyotrophic lateral sclerosis) (HCC)   CAD (coronary artery disease) s/p CABG.   Hypertension   Intracranial bleed (HCC)   Right hand fracture   83 y.o. female with medical history significant of falls, ALS,  coronary artery disease, aortic stenosishypertension, hyperlipidemia,   mild dementia admitted for intracranial bleeding secondary to fall while on aspirin family does not wish for any surgical interventions would like comfort care only   Present on Admission: Intracranial bleeding - family at this point would like comfort care only. I repeated CT brain after discussion w daughter and the bleed is slightly larger on the left temporal area Will check coags , daughter doesn't desire any surgical intervention at this time.  Will observe over night with plan to DC to home if patient is stable. . ALS (amyotrophic lateral sclerosis) (HCC) - chronic resulting in right side hemiparesis and frequent falls . CAD (coronary artery disease) s/p CABG. Stable given intracranial bleeding will hold of on Aspirin  . Hypertension - soft BP hold metoprolol . Right hand fracture - will need to follow up with [facility] orthopedics as an outpatient    Other plan as per orders.  DVT prophylaxis:  SCD      Code Status:  DNR/DNI  as per family  comfort care only  Family Communication:   Family  not at bedside  plan of care was discussed with   Daughter  Disposition Plan:    To home once workup is complete and patient is stable                        Would benefit from PT  eval prior to DC   ordered                     Palliative care   consulted                          Consults called: Neurosurgery aware  Admission status:   inpatient   Level of care    medical floor           Lab Results  Component Value Date   PLT 121 (L) 12/10/2016      Anti-infectives    None        Objective:   Vitals:   12/10/16 2100 12/11/16 0156 12/11/16 0210 12/11/16 0700  BP: (!) 173/119 (!) 154/143 (!) 116/54 (!) 122/58  Pulse: (!) 113 93 82 87  Resp: Temp: 99 F (37.2 C) 99 F (37.2 C)  99 F (37.2 C)  TempSrc: Oral Oral  Oral  SpO2: 95% 99%  99%  Weight: 50.5 kg (111 lb 6.4 oz)     Height:  (1.651 m)       Wt Readings from Last 3 Encounters:  12/10/16 50.5 kg (111 lb 6.4 oz)  09/03/16 50.2 kg (110 lb  9.6 oz)  12/27/14 71.2 kg (157 lb)     Intake/Output Summary (Last 24 hours) at 12/11/16 0738 Last data filed at 12/11/16 0430  Gross per 24 hour  Intake                3 ml  Output                1 ml  Net                2 ml     Physical Exam  Awake Alert, Oriented X 3, No new F.N deficits, Normal affect [place].AT,PERRAL, slight dysarthria,  Supple Neck,No JVD, No cervical lymphadenopathy appriciated.  Symmetrical Chest wall movement, Good air movement bilaterally, CTAB RRR,No Gallops,Rubs or new Murmurs, No Parasternal Heave +ve B.Sounds, Abd Soft, No tenderness, No organomegaly appriciated, No rebound - guarding or rigidity. No Cyanosis, Clubbing or edema, No new Rash or bruise     Data Review:    CBC  Recent Labs Lab 12/10/16 1511  WBC 6.1  HGB 12.7  HCT 39.1  PLT 121*  MCV 88.1  MCH 28.6  MCHC 32.5  RDW 13.6  LYMPHSABS 1.2  MONOABS 0.5  EOSABS 0.1  BASOSABS 0.0     Chemistries   Recent Labs Lab 12/10/16 1632  NA 142  K 4.2  CL 98*  CO2 32  GLUCOSE 98  BUN 24*  CREATININE 0.61  CALCIUM 9.8  AST 23  ALT 17  ALKPHOS 56  BILITOT 0.7   ------------------------------------------------------------------------------------------------------------------ No results for input(s): CHOL, HDL, LDLCALC, TRIG, CHOLHDL, LDLDIRECT in the last 72 hours.  No results found for: HGBA1C ------------------------------------------------------------------------------------------------------------------ No results for input(s): TSH, T4TOTAL, T3FREE, THYROIDAB in the last 72 hours.  Invalid input(s): FREET3 ------------------------------------------------------------------------------------------------------------------ No results for input(s): VITAMINB12, FOLATE, FERRITIN, TIBC, IRON, RETICCTPCT in the last 72 hours.  Coagulation profile No results for input(s): INR, PROTIME in the last 168 hours.  No results for input(s): DDIMER in the last 72 hours.  Cardiac Enzymes No results for input(s): CKMB, TROPONINI, MYOGLOBIN in the last 168 hours.  Invalid input(s): CK ------------------------------------------------------------------------------------------------------------------    Component Value Date/Time   BNP 172.7 (H) 09/01/2016 1740    Inpatient Medications  Scheduled Meds: . diphenhydrAMINE  25-50 mg Oral QHS   And  . acetaminophen  500-1,000 mg Oral QHS  . sodium chloride flush  3 mL Intravenous Q12H   Continuous Infusions: PRN Meds:.sodium chloride, acetaminophen **OR** acetaminophen, antiseptic oral rinse, haloperidol **OR** haloperidol **OR** haloperidol lactate, ondansetron **OR** ondansetron (ZOFRAN) IV, oxyCODONE, polyvinyl alcohol, sodium chloride flush  Micro Results No results found for this or any previous visit (from the past 240 hour(s)).  Radiology Reports Dg Chest 2 View  Result Date: 12/10/2016 CLINICAL DATA:  Syncope  with fall EXAM: CHEST  2 VIEW COMPARISON:  Chest radiograph August 30, 2016 and chest CT September 01, 2016 FINDINGS: There is no edema or consolidation. Heart is upper normal in size with pulmonary vascularity within normal limits. No adenopathy. There is aortic atherosclerosis. Patient is status post coronary artery bypass grafting. No pneumothorax. There is an old healed fracture of the proximal right humerus. No acute fracture evident. IMPRESSION: No edema or consolidation. There is aortic atherosclerosis. No pneumothorax. Old healed fracture proximal right humerus. Electronically Signed   By: Bretta Bang III M.D.   On: 12/10/2016 16:18   Ct Head Wo Contrast  Addendum Date: 12/10/2016   ADDENDUM REPORT: 12/10/2016 17:50 ADDENDUM: Study discussed by telephone with Dr.  DANA LIU on 12/10/2016 at 1744 hours. Electronically Signed   By: Odessa Fleming M.D.   On: 12/10/2016 17:50   Result Date: 12/10/2016 CLINICAL DATA:  83 year old female status post fall with scalp hematoma. EXAM: CT HEAD WITHOUT CONTRAST CT CERVICAL SPINE WITHOUT CONTRAST TECHNIQUE: Multidetector CT imaging of the head and cervical spine was performed following the standard protocol without intravenous contrast. Multiplanar CT image reconstructions of the cervical spine were also generated. COMPARISON:  Brain MRI 12/07/2014, cervical spine MRI 11/26/2014 FINDINGS: CT HEAD FINDINGS Brain: 22 mm oval hyperdense hemorrhage in the posterior inferior left temporal lobe with mild adjacent edema. No significant regional mass effect. No left side extra-axial hemorrhage identified. No intraventricular hemorrhage. Along the right frontal operculum there is trace subarachnoid hemorrhage (series 4, image 19). No other extra-axial hemorrhage identified. Cerebral volume has not significantly changed. No ventriculomegaly. Patent basilar cisterns. No midline shift. No cortical encephalomalacia. No superimposed acute cortically based infarct identified. Vascular:  Calcified atherosclerosis at the skull base. Skull: Calvarium intact.  No skull fracture identified. Sinuses/Orbits: Visualized paranasal sinuses and mastoids are stable and well pneumatized. Other: Large broad-based right anterior convexity scalp hematoma with dressing material in place. Hematoma measures up to 14 mm in thickness. Underlying calvarium intact with hyperostosis frontalis. Other scalp soft tissues within normal limits. Negative orbits soft tissues. CT CERVICAL SPINE FINDINGS Alignment: Increase straightening of cervical lordosis compared to 2016. Cervicothoracic junction alignment is within normal limits. Bilateral posterior element alignment is within normal limits. Skull base and vertebrae: Visualized skull base is intact. No atlanto-occipital dissociation. No cervical spine fracture identified. Soft tissues and spinal canal: Mild motion artifact. Calcified carotid artery atherosclerosis. Negative noncontrast neck soft tissues. Disc levels: Widespread degenerative or diffuse idiopathic skeletal hyperostosis related cervical endplate spurring. Flowing osteophytes in the upper thoracic spine. Upper cervical facet degeneration. Disc space loss at C5-C6 and C6-C7. No significant cervical spinal stenosis suspected. Upper chest: Visible upper thoracic levels appear intact. Calcified aortic atherosclerosis. Negative lung apices. IMPRESSION: 1. Coup contrecoup brain injury. Left posterior temporal lobe hemorrhagic contusion measuring 22 mm with mild edema, and trace right frontal operculum subarachnoid hemorrhage. No intracranial mass effect. 2. Large right frontal convexity scalp hematoma without underlying fracture. 3.  No acute fracture or listhesis in the cervical spine. 4. Calcified aortic atherosclerosis. Electronically Signed: By: Odessa Fleming M.D. On: 12/10/2016 17:40   Ct Cervical Spine Wo Contrast  Addendum Date: 12/10/2016   ADDENDUM REPORT: 12/10/2016 17:50 ADDENDUM: Study discussed by telephone  with Dr. Annabelle Harman LIU on 12/10/2016 at 1744 hours. Electronically Signed   By: Odessa Fleming M.D.   On: 12/10/2016 17:50   Result Date: 12/10/2016 CLINICAL DATA:  83 year old female status post fall with scalp hematoma. EXAM: CT HEAD WITHOUT CONTRAST CT CERVICAL SPINE WITHOUT CONTRAST TECHNIQUE: Multidetector CT imaging of the head and cervical spine was performed following the standard protocol without intravenous contrast. Multiplanar CT image reconstructions of the cervical spine were also generated. COMPARISON:  Brain MRI 12/07/2014, cervical spine MRI 11/26/2014 FINDINGS: CT HEAD FINDINGS Brain: 22 mm oval hyperdense hemorrhage in the posterior inferior left temporal lobe with mild adjacent edema. No significant regional mass effect. No left side extra-axial hemorrhage identified. No intraventricular hemorrhage. Along the right frontal operculum there is trace subarachnoid hemorrhage (series 4, image 19). No other extra-axial hemorrhage identified. Cerebral volume has not significantly changed. No ventriculomegaly. Patent basilar cisterns. No midline shift. No cortical encephalomalacia. No superimposed acute cortically based infarct identified. Vascular: Calcified atherosclerosis at the  skull base. Skull: Calvarium intact.  No skull fracture identified. Sinuses/Orbits: Visualized paranasal sinuses and mastoids are stable and well pneumatized. Other: Large broad-based right anterior convexity scalp hematoma with dressing material in place. Hematoma measures up to 14 mm in thickness. Underlying calvarium intact with hyperostosis frontalis. Other scalp soft tissues within normal limits. Negative orbits soft tissues. CT CERVICAL SPINE FINDINGS Alignment: Increase straightening of cervical lordosis compared to 2016. Cervicothoracic junction alignment is within normal limits. Bilateral posterior element alignment is within normal limits. Skull base and vertebrae: Visualized skull base is intact. No atlanto-occipital  dissociation. No cervical spine fracture identified. Soft tissues and spinal canal: Mild motion artifact. Calcified carotid artery atherosclerosis. Negative noncontrast neck soft tissues. Disc levels: Widespread degenerative or diffuse idiopathic skeletal hyperostosis related cervical endplate spurring. Flowing osteophytes in the upper thoracic spine. Upper cervical facet degeneration. Disc space loss at C5-C6 and C6-C7. No significant cervical spinal stenosis suspected. Upper chest: Visible upper thoracic levels appear intact. Calcified aortic atherosclerosis. Negative lung apices. IMPRESSION: 1. Coup contrecoup brain injury. Left posterior temporal lobe hemorrhagic contusion measuring 22 mm with mild edema, and trace right frontal operculum subarachnoid hemorrhage. No intracranial mass effect. 2. Large right frontal convexity scalp hematoma without underlying fracture. 3.  No acute fracture or listhesis in the cervical spine. 4. Calcified aortic atherosclerosis. Electronically Signed: By: Odessa Fleming M.D. On: 12/10/2016 17:40   Dg Hand Complete Right  Result Date: 12/10/2016 CLINICAL DATA:  Pain following fall EXAM: RIGHT HAND - COMPLETE 3+ VIEW COMPARISON:  None. FINDINGS: Frontal, oblique, and lateral views were obtained. There is a transverse fracture of the distal radial metaphysis with alignment essentially anatomic. No other acute fracture. No dislocation. Bones are diffusely osteoporotic. There is osteoarthritic change in all PIP and DIP joints, most marked in the first IP and second DIP joints. There is no erosive change or bony destruction. IMPRESSION: Transversely oriented fracture distal radial metaphysis with alignment essentially anatomic. No other fractures. No dislocation. Several areas of osteoarthritic change in distal joints. Bones diffusely osteoporotic. Electronically Signed   By: Bretta Bang III M.D.   On: 12/10/2016 16:20    Time Spent in minutes  30   Pearson Grippe M.D on 12/11/2016  at 7:38 AM  Between 7am to 7pm - Pager - [phone]  After 7pm go to [email or web address] - password [email or web address]  [facility] -  Office  [phone]

## 2016-12-11 NOTE — Consult Note (Signed)
Consultation Note Date: 12/11/2016   Patient Name: Barbara Collins  DOB: [birth year]  MRN: [id number]  Age / Sex: 83 y.o., female  PCP: Provider Not In System Referring Physician: Pearson Grippe, MD  Reason for Consultation: Establishing goals of care, Hospice Evaluation and Psychosocial/spiritual support  HPI/Patient Profile: 83 y.o. female  with past medical history of ALS diagnosed 2 years ago, coronary artery disease, aortic stenosis, hypertension, hyperlipidemia, dementia admitted on 12/10/2016 after a fall. Patient had been to the hair salon and was getting ready to get in the car and subsequently fell face forward. She currently has right-sided weakness secondary to history of stroke as well as ALS. She has been having multiple falls but her last fall prior to this was in January.  Patient has had a repeat CT scan of the head and shows an increase in hemorrhage to the left temporal region. Area with subarachnoid hemorrhage is unchanged. Patient is clinically different overnight in that she now is unable to find her words, exhibiting word salad  Clinical Assessment and Goals of Care: Spoke to her daughter, Barbara Collins, by phone. Patient lives with her daughter. Per Barbara Collins, patient is followed at the ALS clinic at [facility]. She is a retired Engineer, civil (consulting). When she received this diagnosis 2 years ago she decided that she would not want a trach or PEG and is a DO NOT RESUSCITATE.  NEXT OF KIN daughter Barbara Collins [phone]    SUMMARY OF RECOMMENDATIONS   DNR/DNI No trach no PEG Interested in hospice in the home Order placed to care management Code Status/Advance Care Planning:  DNR  Palliative Prophylaxis:   Aspiration, Bowel Regimen, Delirium Protocol, Eye Care, Frequent Pain Assessment, Oral Care, Palliative Wound Care and Turn Reposition  Additional Recommendations (Limitations, Scope, Preferences):  Avoid  Hospitalization, Initiate Comfort Feeding, No Artificial Feeding, No Chemotherapy, No Hemodialysis, No Lab Draws, No Surgical Procedures and No Tracheostomy  Psycho-social/Spiritual:   Desire for further Chaplaincy support:no  Additional Recommendations: Grief/Bereavement Support  Prognosis:   < 6 months in the setting of ALS, dementia  Discharge Planning: Home with Hospice      Primary Diagnoses: Present on Admission: . ALS (amyotrophic lateral sclerosis) (HCC) . CAD (coronary artery disease) s/p CABG. . Hypertension . Right hand fracture   I have reviewed the medical record, interviewed the patient and family, and examined the patient. The following aspects are pertinent.  Past Medical History:  Diagnosis Date  . ALS (amyotrophic lateral sclerosis) (HCC)   . Aortic stenosis   . CAD (coronary artery disease)   . Hyperlipemia   . Hypertension   . Lumbar spinal stenosis   . Neuropathy    Feet  . Right arm weakness    Social History   Social History  . Marital status: Widowed    Spouse name: N/A  . Number of children: 1  . Years of education: College   Social History Main Topics  . Smoking status: Former Games developer  . Smokeless tobacco: Never Used  . Alcohol use No  Comment: Rare  . Drug use: No  . Sexual activity: Not Asked   Other Topics Concern  . None   Social History Narrative   Lives at home with her daughter.   Right-handed.   2 cups caffeine/day      Family History  Problem Relation Age of Onset  . Hypertension Mother   . CAD Mother   . Stroke Mother    Scheduled Meds: . diphenhydrAMINE  25-50 mg Oral QHS   And  . acetaminophen  500-1,000 mg Oral QHS  . sodium chloride flush  3 mL Intravenous Q12H   Continuous Infusions: PRN Meds:.sodium chloride, acetaminophen **OR** acetaminophen, antiseptic oral rinse, haloperidol **OR** haloperidol **OR** haloperidol lactate, ondansetron **OR** ondansetron (ZOFRAN) IV, oxyCODONE, polyvinyl alcohol,  sodium chloride flush Medications Prior to Admission:  Prior to Admission medications   Medication Sig Start Date End Date Taking? Authorizing Provider  aspirin EC 81 MG tablet Take 81 mg by mouth daily after supper.    Yes Historical Provider, MD  Cholecalciferol (VITAMIN D3) 2000 units capsule Take 2,000 Units by mouth daily after supper.    Yes Historical Provider, MD  diphenhydramine-acetaminophen (TYLENOL PM) 25-500 MG TABS tablet Take 1 tablet by mouth at bedtime.   Yes Historical Provider, MD  metoprolol (LOPRESSOR) 50 MG tablet Take 25 mg by mouth daily after supper.    Yes Historical Provider, MD  potassium chloride (K-DUR,KLOR-CON) 10 MEQ tablet Take 10 mEq by mouth daily after supper.    Yes Historical Provider, MD  acetaminophen (TYLENOL) 325 MG tablet Take 2 tablets (650 mg total) by mouth every 6 (six) hours as needed for mild pain (or Fever >/= 101). Patient not taking: Reported on 12/10/2016 09/06/16   Alison Murray, MD  diphenhydrAMINE (BENADRYL) 25 mg capsule Take 1 capsule (25 mg total) by mouth at bedtime as needed for sleep (give with Tylenol to make Tylenol pm). Patient not taking: Reported on 12/10/2016 09/06/16   Alison Murray, MD  guaiFENesin (ROBITUSSIN) 100 MG/5ML SOLN Take 5 mLs (100 mg total) by mouth every 4 (four) hours as needed for cough or to loosen phlegm. Patient not taking: Reported on 12/10/2016 09/06/16   Alison Murray, MD  riluzole (RILUTEK) 50 MG tablet Take 1 tablet (50 mg total) by mouth every 12 (twelve) hours. Patient not taking: Reported on 12/10/2016 01/14/15   Levert Feinstein, MD   Allergies  Allergen Reactions  . Codeine Shortness Of Breath and Nausea Only   Review of Systems  Unable to perform ROS: Mental status change    Physical Exam  Constitutional:  Cachectic, frail elderly female  HENT:  Temporal wasting Bandage to top of her head  Neck: Normal range of motion.  Pulmonary/Chest: Effort normal.  Neurological: She is alert.  Word salad. She is  alert ;she appears to recognize that the words that are coming out of her mouth are not but she intends to say and becomes frustrated She is able to get up to the bedside commode with a 1 person assist  Skin: Skin is warm and dry.  Bandage to the top of her head with dried blood  Psychiatric:  Frustrated at times with her inability to communicate what she wants to communicate  Nursing note and vitals reviewed.   Vital Signs: BP 128/61 (BP Location: Left Arm)   Pulse (!) 105   Temp 98.3 F (36.8 C) (Oral)   Resp 20   Ht  (1.651 m)   Wt 50.5 kg (  111 lb 6.4 oz)   SpO2 99%   BMI 18.54 kg/m  Pain Assessment: 0-10   Pain Score: 4    SpO2: SpO2: 99 % O2 Device:SpO2: 99 % O2 Flow Rate: .   IO: Intake/output summary:  Intake/Output Summary (Last 24 hours) at 12/11/16 1431 Last data filed at 12/11/16 0430  Gross per 24 hour  Intake                3 ml  Output                1 ml  Net                2 ml    LBM: Last BM Date: 12/09/16 Baseline Weight: Weight: 45.4 kg (100 lb) Most recent weight: Weight: 50.5 kg (111 lb 6.4 oz)     Palliative Assessment/Data:   Flowsheet Rows     Most Recent Value  Intake Tab  Referral Department  Hospitalist  Unit at Time of Referral  Med/Surg Unit  Palliative Care Primary Diagnosis  Trauma  Date Notified  12/10/16  Reason for referral  Clarify Goals of Care, Counsel Regarding Hospice  Date of Admission  12/10/16  Date first seen by Palliative Care  12/11/16  # of days Palliative referral response time  1 Day(s)  # of days IP prior to Palliative referral  0  Clinical Assessment  Pain Max last 24 hours  Not able to report  Pain Min Last 24 hours  Not able to report  Dyspnea Max Last 24 Hours  Not able to report  Dyspnea Min Last 24 hours  Not able to report  Nausea Max Last 24 Hours  Not able to report  Nausea Min Last 24 Hours  Not able to report  Anxiety Max Last 24 Hours  Not able to report  Anxiety Min Last 24 Hours  Not  able to report  Other Max Last 24 Hours  Not able to report  Psychosocial & Spiritual Assessment  Palliative Care Outcomes  Patient/Family meeting held?  Yes  Who was at the meeting?  daughter Barbara Collins who is a SW with [employer]  Palliative Care Outcomes  Clarified goals of care, Changed to focus on comfort, Counseled regarding hospice, Transitioned to hospice  Patient/Family wishes: Interventions discontinued/not started   Vasopressors, Hemodialysis, Tube feedings/TPN, Trach, Mechanical Ventilation, PEG  Palliative Care follow-up planned  No      Time In: 1700 Time Out: 1820 Time Total: 80 min Greater than 50%  of this time was spent counseling and coordinating care related to the above assessment and plan. Staffed with Dr. Selena Batten  Signed by: Irean Hong, NP   Please contact Palliative Medicine Team phone at [phone] for questions and concerns.  For individual provider: See Loretha Stapler

## 2016-12-11 NOTE — Progress Notes (Signed)
PT Cancellation/Discharge Note  Patient Details Name: Barbara Collins MRN: [id number] DOB: [birth year]   Cancelled Treatment:    Reason Eval/Treat Not Completed: Medical issues which prohibited therapy. Patient/family requesting comfort care only.  Spoke with daughter and Palliative Care.  Daughter politely declined PT at this time.  PT will sign off.   Vena Austria 12/11/2016, 5:35 PM Durenda Hurt. Renaldo Fiddler, [facility]hab [facility] Pager [phone]

## 2016-12-11 NOTE — Progress Notes (Signed)
[place] radiology staff called to give report. MD is paged and notified--see results.

## 2016-12-12 NOTE — Care Management Note (Signed)
Case Management Note  Patient Details  Name: Barbara Collins MRN: [id number] Date of Birth: 12-18-1932  Subjective/Objective:                  Intracranial bleeding  Action/Plan: Discharge planning Expected Discharge Date: 12/12/16              Expected Discharge Plan:  Home w Hospice Care  In-House Referral:     Discharge planning Services  CM Consult  Post Acute Care Choice:    Choice offered to:  Patient, Adult Children  DME Arranged:  3-N-1, Gel overlay, Hospital bed, Overbed table DME Agency:  Other - Comment  HH Arranged:  NA HH Agency:  [facility]  Status of Service:  Completed, signed off  If discussed at [place] of Stay Meetings, dates discussed:    Additional Comments: Cm met with pt and spoke with pt's permission spoke with daughter of pt, [name], ([place] with [place]) [phone] for choice of home hospice agency and family [facility]. Referral called to [place], Amy [phone]. Jody states ok for pt to go home without DME (if delivered tomorrow).  Family requesting Hospital bed, gel overlay, 3n1, and over the bed tray table.  NO other CM needs were communicated. Dellie Catholic, RN 12/12/2016, 11:44 AM

## 2016-12-12 NOTE — Discharge Summary (Addendum)
Barbara Collins, is a 83 y.o. female  DOB [birth year]  MRN [id number].  Admission date:  12/10/2016  Admitting Physician  Therisa Doyne, MD  Discharge Date:  12/12/2016   Primary MD  PROVIDER NOT IN SYSTEM  Recommendations for primary care physician for things to follow:   Intracranial bleeding - family at this point would like comfort care only. I repeated CT brain after discussion w daughter and the bleed is slightly larger on the left temporal area daughter doesn't desire any surgical intervention at this time.  NO ASPIRIN d/w daughter Please f/u with pcp this week Daughter would like to transition to hospice  Skin lac right forehead/side of head, stable Routine wound care   . ALS (amyotrophic lateral sclerosis) (HCC)- chronic resulting in right side hemiparesis and frequent falls . CAD (coronary artery disease) s/p CABG. Stable  . Hypertension - Stop metoprolol and monitor bp at home. If rises then can resume.  . Right hand fracture - will need to follow up with [facility] orthopedics as an outpatient     Admission Diagnosis  Intracranial hemorrhage (HCC) [I62.9] Fall [W19.XXXA] Other closed fracture of distal end of right radius, initial encounter [S52.591A]   Discharge Diagnosis  Intracranial hemorrhage (HCC) [I62.9] Fall [W19.XXXA] Other closed fracture of distal end of right radius, initial encounter [S52.591A]    Active Problems:   ALS (amyotrophic lateral sclerosis) (HCC)   CAD (coronary artery disease) s/p CABG.   Hypertension   Intracranial bleed (HCC)   Right hand fracture   Intracranial hemorrhage [facility])   Palliative care by specialist      Past Medical History:  Diagnosis Date  . ALS (amyotrophic lateral sclerosis) (HCC)   . Aortic stenosis   . CAD (coronary artery disease)   . Hyperlipemia   . Hypertension   . Lumbar spinal stenosis   . Neuropathy    Feet  .  Right arm weakness     Past Surgical History:  Procedure Laterality Date  . CARDIAC SURGERY     2005, CABG x 2  . CATARACT EXTRACTION     Bilateral  . CESAREAN SECTION    . CHOLECYSTECTOMY    . 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 Denied LOC patient has right-sided paralysis at baseline secondary to history of stroke and ALSfor was not truly weakness by daughter who was opening a door for patient. He will actually landed on her face. Denies any recent fevers or chills no chest pain no abdominal pain or dyspnea patient was slightly confused today but that's her normal.  She has had multiple falls with  fractures in the past  Regarding pertinent Chronic problems:coronary artery disease status post CABG in 2005 on aspirin Not on anticoagulation atbaseline. Hx of ALS resulting in right side weakness  IN ZO:XWRU (24hrs), Avg:97.9 F (36.6 C), Min:97.9 F (36.6 C), Max:97.9 F (36.6 C) RR 24 blood pressure 112/72 HR 82 Sodium 142 potassium 4.2 BUN 24 creatinine 0.61 WBC 6.1 HG 12.7 plt 121  CT head cooper and contrecoup brain injury left posterior temporal lobe hemorrhagic contusion and trace right frontal subarachnoid hemorrhage Following Medications were ordered in ER: Medications  fentaNYL (SUBLIMAZE) injection 25 mcg (25 mcg Intravenous Given 12/10/16 1758)     ER provider discussed case with: Dr. Dutch Quint with neurosurgery who felt the patient not a surgical candidate but at recommends internal medicine to admit for observation    Hospital Course:       Pt admitted and Aspirin  held. coags normal. had repeat CT brain that showed increase in intracranial hemorrhage left temporal lobe.  Neurosurgery consulted, see above for details.     Discussed with daughter and palliative care,  No surgical intervention desired at this time.  Daughter would like Palliative /Hospice to follow patient at home.  Pt stable and daughter would like her to come home today.     Follow UP  Follow-up Information    bedlack,Richard Follow up in 1 week(s).        FULP, CAMMIE, MD Follow up.   Specialty:  Family Medicine Why:  please f/u with [facility] information: [phone] [address] [place] [address] [phone]            Consults obtained -  Palliative care, neurosurgery  Discharge Condition: stable  Diet and Activity recommendation: See Discharge Instructions below  Discharge Instructions    No aspirin please      Discharge Medications     Allergies as of 12/12/2016      Reactions   Codeine Shortness Of Breath, Nausea Only      Medication List    STOP taking these medications   acetaminophen 325 MG tablet Commonly known as:  TYLENOL   aspirin EC 81 MG tablet   diphenhydrAMINE 25 mg capsule Commonly known as:  BENADRYL   diphenhydramine-acetaminophen 25-500 MG Tabs tablet Commonly known as:  TYLENOL PM   guaiFENesin 100 MG/5ML Soln Commonly known as:  ROBITUSSIN   metoprolol 50 MG tablet Commonly known as:  LOPRESSOR   riluzole 50 MG tablet Commonly known as:  RILUTEK     TAKE these medications   potassium chloride 10 MEQ tablet Commonly known as:  K-DUR,KLOR-CON Take 10 mEq by mouth daily after supper.   Vitamin D3 2000 units capsule Take 2,000 Units by mouth daily after supper.       Major procedures and Radiology Reports - PLEASE review detailed and final reports for all details, in brief -      Dg Chest 2 View  Result Date: 12/10/2016 CLINICAL DATA:  Syncope with fall EXAM: CHEST  2 VIEW COMPARISON:   Chest radiograph August 30, 2016 and chest CT September 01, 2016 FINDINGS: There is no edema or consolidation. Heart is upper normal in size with pulmonary vascularity within normal limits. No adenopathy. There is aortic atherosclerosis. Patient is status post coronary artery bypass grafting. No pneumothorax. There is an old healed fracture of the proximal right humerus. No acute fracture evident. IMPRESSION: No edema or consolidation. There is aortic atherosclerosis. No pneumothorax. Old healed fracture proximal right  humerus. Electronically Signed   By: Bretta Bang III M.D.   On: 12/10/2016 16:18   Ct Head Wo Contrast  Result Date: 12/11/2016 CLINICAL DATA:  Follow-up intracranial hemorrhage EXAM: CT HEAD WITHOUT CONTRAST TECHNIQUE: Contiguous axial images were obtained from the base of the skull through the vertex without intravenous contrast. COMPARISON:  12/10/2016 FINDINGS: Brain: Parenchymal hemorrhage is again noted in the posterior aspect of the left temporal lobe with surrounding white matter edema. The size of the hematoma has increased in size now measuring 2.6 by 2.2 cm in greatest dimension. Previously seen trace right frontal subarachnoid hemorrhage is again identified and slightly less prominent. No new areas of hemorrhage are seen. No findings to suggest acute infarct or space-occupying mass lesion are noted. Vascular: No hyperdense vessel or unexpected calcification. Skull: Normal. Negative for fracture or focal lesion. Sinuses/Orbits: No acute finding. Other: Scalp hematoma is again noted on the right but decreased in size when compared with the prior exam. IMPRESSION: Stable appearing subarachnoid hemorrhage in the right frontal region. Increased parenchymal hemorrhage in the posterior aspect of the left temporal lobe with increasing surrounding white matter edema. These results will be called to the ordering clinician or representative by the Radiologist Assistant, and communication  documented in the PACS or zVision Dashboard. Electronically Signed   By: Alcide Clever M.D.   On: 12/11/2016 16:47   Ct Head Wo Contrast  Addendum Date: 12/10/2016   ADDENDUM REPORT: 12/10/2016 17:50 ADDENDUM: Study discussed by telephone with Dr. Annabelle Harman [facility] on 12/10/2016 at 1744 hours. Electronically Signed   By: Odessa Fleming M.D.   On: 12/10/2016 17:50   Result Date: 12/10/2016 CLINICAL DATA:  83 year old female status post fall with scalp hematoma. EXAM: CT HEAD WITHOUT CONTRAST CT CERVICAL SPINE WITHOUT CONTRAST TECHNIQUE: Multidetector CT imaging of the head and cervical spine was performed following the standard protocol without intravenous contrast. Multiplanar CT image reconstructions of the cervical spine were also generated. COMPARISON:  Brain MRI 12/07/2014, cervical spine MRI 11/26/2014 FINDINGS: CT HEAD FINDINGS Brain: 22 mm oval hyperdense hemorrhage in the posterior inferior left temporal lobe with mild adjacent edema. No significant regional mass effect. No left side extra-axial hemorrhage identified. No intraventricular hemorrhage. Along the right frontal operculum there is trace subarachnoid hemorrhage (series 4, image 19). No other extra-axial hemorrhage identified. Cerebral volume has not significantly changed. No ventriculomegaly. Patent basilar cisterns. No midline shift. No cortical encephalomalacia. No superimposed acute cortically based infarct identified. Vascular: Calcified atherosclerosis at the skull base. Skull: Calvarium intact.  No skull fracture identified. Sinuses/Orbits: Visualized paranasal sinuses and mastoids are stable and well pneumatized. Other: Large broad-based right anterior convexity scalp hematoma with dressing material in place. Hematoma measures up to 14 mm in thickness. Underlying calvarium intact with hyperostosis frontalis. Other scalp soft tissues within normal limits. Negative orbits soft tissues. CT CERVICAL SPINE FINDINGS Alignment: Increase straightening of  cervical lordosis compared to 2016. Cervicothoracic junction alignment is within normal limits. Bilateral posterior element alignment is within normal limits. Skull base and vertebrae: Visualized skull base is intact. No atlanto-occipital dissociation. No cervical spine fracture identified. Soft tissues and spinal canal: Mild motion artifact. Calcified carotid artery atherosclerosis. Negative noncontrast neck soft tissues. Disc levels: Widespread degenerative or diffuse idiopathic skeletal hyperostosis related cervical endplate spurring. Flowing osteophytes in the upper thoracic spine. Upper cervical facet degeneration. Disc space loss at C5-C6 and C6-C7. No significant cervical spinal stenosis suspected. Upper chest: Visible upper thoracic levels appear intact. Calcified aortic atherosclerosis. Negative lung apices.  IMPRESSION: 1. Coup contrecoup brain injury. Left posterior temporal lobe hemorrhagic contusion measuring 22 mm with mild edema, and trace right frontal operculum subarachnoid hemorrhage. No intracranial mass effect. 2. Large right frontal convexity scalp hematoma without underlying fracture. 3.  No acute fracture or listhesis in the cervical spine. 4. Calcified aortic atherosclerosis. Electronically Signed: By: Odessa Fleming M.D. On: 12/10/2016 17:40   Ct Cervical Spine Wo Contrast  Addendum Date: 12/10/2016   ADDENDUM REPORT: 12/10/2016 17:50 ADDENDUM: Study discussed by telephone with Dr. Annabelle Harman [facility] on 12/10/2016 at 1744 hours. Electronically Signed   By: Odessa Fleming M.D.   On: 12/10/2016 17:50   Result Date: 12/10/2016 CLINICAL DATA:  83 year old female status post fall with scalp hematoma. EXAM: CT HEAD WITHOUT CONTRAST CT CERVICAL SPINE WITHOUT CONTRAST TECHNIQUE: Multidetector CT imaging of the head and cervical spine was performed following the standard protocol without intravenous contrast. Multiplanar CT image reconstructions of the cervical spine were also generated. COMPARISON:  Brain MRI  12/07/2014, cervical spine MRI 11/26/2014 FINDINGS: CT HEAD FINDINGS Brain: 22 mm oval hyperdense hemorrhage in the posterior inferior left temporal lobe with mild adjacent edema. No significant regional mass effect. No left side extra-axial hemorrhage identified. No intraventricular hemorrhage. Along the right frontal operculum there is trace subarachnoid hemorrhage (series 4, image 19). No other extra-axial hemorrhage identified. Cerebral volume has not significantly changed. No ventriculomegaly. Patent basilar cisterns. No midline shift. No cortical encephalomalacia. No superimposed acute cortically based infarct identified. Vascular: Calcified atherosclerosis at the skull base. Skull: Calvarium intact.  No skull fracture identified. Sinuses/Orbits: Visualized paranasal sinuses and mastoids are stable and well pneumatized. Other: Large broad-based right anterior convexity scalp hematoma with dressing material in place. Hematoma measures up to 14 mm in thickness. Underlying calvarium intact with hyperostosis frontalis. Other scalp soft tissues within normal limits. Negative orbits soft tissues. CT CERVICAL SPINE FINDINGS Alignment: Increase straightening of cervical lordosis compared to 2016. Cervicothoracic junction alignment is within normal limits. Bilateral posterior element alignment is within normal limits. Skull base and vertebrae: Visualized skull base is intact. No atlanto-occipital dissociation. No cervical spine fracture identified. Soft tissues and spinal canal: Mild motion artifact. Calcified carotid artery atherosclerosis. Negative noncontrast neck soft tissues. Disc levels: Widespread degenerative or diffuse idiopathic skeletal hyperostosis related cervical endplate spurring. Flowing osteophytes in the upper thoracic spine. Upper cervical facet degeneration. Disc space loss at C5-C6 and C6-C7. No significant cervical spinal stenosis suspected. Upper chest: Visible upper thoracic levels appear  intact. Calcified aortic atherosclerosis. Negative lung apices. IMPRESSION: 1. Coup contrecoup brain injury. Left posterior temporal lobe hemorrhagic contusion measuring 22 mm with mild edema, and trace right frontal operculum subarachnoid hemorrhage. No intracranial mass effect. 2. Large right frontal convexity scalp hematoma without underlying fracture. 3.  No acute fracture or listhesis in the cervical spine. 4. Calcified aortic atherosclerosis. Electronically Signed: By: Odessa Fleming M.D. On: 12/10/2016 17:40   Dg Hand Complete Right  Result Date: 12/10/2016 CLINICAL DATA:  Pain following fall EXAM: RIGHT HAND - COMPLETE 3+ VIEW COMPARISON:  None. FINDINGS: Frontal, oblique, and lateral views were obtained. There is a transverse fracture of the distal radial metaphysis with alignment essentially anatomic. No other acute fracture. No dislocation. Bones are diffusely osteoporotic. There is osteoarthritic change in all PIP and DIP joints, most marked in the first IP and second DIP joints. There is no erosive change or bony destruction. IMPRESSION: Transversely oriented fracture distal radial metaphysis with alignment essentially anatomic. No other fractures. No dislocation. Several areas of  osteoarthritic change in distal joints. Bones diffusely osteoporotic. Electronically Signed   By: Bretta Bang III M.D.   On: 12/10/2016 16:20    Micro Results    No results found for this or any previous visit (from the past 240 hour(s)).     Today   Subjective    Barbara Collins today has no headache,no chest abdominal pain,no new weakness tingling or numbness, feel better wants to go home today.    Objective   Blood pressure 109/65, pulse 82, temperature 97.5 F (36.4 C), temperature source Axillary, resp. rate 20, height  (1.651 m), weight 50.5 kg (111 lb 6.4 oz), SpO2 94 %.  No intake or output data in the 24 hours ending 12/12/16 0831  Exam Awake Alert, Oriented x 1 (person, per daughter  baseline), No new F.N deficits, Normal affect Percival.AT,PERRAL, speech fluent,  Supple Neck,No JVD, No cervical lymphadenopathy appriciated.  Symmetrical Chest wall movement, Good air movement bilaterally, CTAB RRR,No Gallops,Rubs or new Murmurs, No Parasternal Heave +ve B.Sounds, Abd Soft, Non tender, No organomegaly appriciated, No rebound -guarding or rigidity. No Cyanosis, Clubbing or edema, No new Rash or bruise Pt able to move right arm and leg.  Skin lac on the right side of head, routine wound care   Data Review   CBC w Diff:  Lab Results  Component Value Date   WBC 6.1 12/10/2016   HGB 12.7 12/10/2016   HCT 39.1 12/10/2016   HCT 35.1 12/23/2014   PLT 121 (L) 12/10/2016   PLT 146 (L) 12/23/2014   LYMPHOPCT 19 12/10/2016   MONOPCT 8 12/10/2016   EOSPCT 1 12/10/2016   BASOPCT 0 12/10/2016    CMP:  Lab Results  Component Value Date   NA 142 12/10/2016   NA 140 12/23/2014   K 4.2 12/10/2016   CL 98 (L) 12/10/2016   CO2 32 12/10/2016   BUN 24 (H) 12/10/2016   BUN 27 12/23/2014   CREATININE 0.61 12/10/2016   PROT 6.2 (L) 12/10/2016   PROT 6.3 12/23/2014   ALBUMIN 3.7 12/10/2016   ALBUMIN 3.9 12/23/2014   BILITOT 0.7 12/10/2016   BILITOT 0.3 12/23/2014   ALKPHOS 56 12/10/2016   AST 23 12/10/2016   ALT 17 12/10/2016  .   Total Time in preparing paper work, data evaluation and todays exam - 35 minutes  Pearson Grippe M.D on 12/12/2016 at 8:31 AM  [facility]   Office  [phone]

## 2016-12-12 NOTE — Progress Notes (Signed)
[facility] Hospital Liaison:  RN  Notified by Maralyn Sago, CSW, of patient/family request for [facility] services at home after discharge.  Chart and patient information under review with Dr. Mervyn Skeeters Mazzocchi, [facility].  Hospice eligibility is pending.  Writer spoke with Pollyann Savoy, daughter, over the phone to initiate education related to hospice philosophy, services and team approach to care.  Family voiced understanding of same.  Per discussion, plan is for discharge to home by private vehicle 12/12/16.  Please send signed and completed DNR form home with patient/family.  Patient will need prescriptions for discharge comfort medications.    DME needs discussed and patient does not currently have equipment in the home.  Family requests the following DME for delivery to the home:  3 in 1, hospital bed with 1/2 rails, OBT.  Family denies need for any additional equipment at this time. I placed the order through [facility] today and will send [facility] equipment manager, Loistine Simas, notification of same.  [facility] to arrange delivery to the home.  The home address has been verified and is correct in the chart; Jody or Amada Jupiter are the family members to be contacted to arrange time of delivery.    [facility] Referral Center aware of the above.  Completed d/c summary will need to be faxed to [facility] at [phone], when final.  Please notify [facility] when patient is ready to leave unit at discharge --call [phone] (or [phone] on weekends or after 5pm).  Above information shared with Maralyn Sago, CSW.  Please call with any questions.  Thank you for the referral.  Adele Barthel, RN, BSN [facility] Hospital Liaison [phone]  All hospital liaison's are now on AMION.

## 2016-12-13 DIAGNOSIS — I619 Nontraumatic intracerebral hemorrhage, unspecified: Secondary | ICD-10-CM | POA: Diagnosis not present

## 2016-12-13 DIAGNOSIS — F0150 Vascular dementia without behavioral disturbance: Secondary | ICD-10-CM | POA: Diagnosis not present

## 2016-12-13 DIAGNOSIS — I25810 Atherosclerosis of coronary artery bypass graft(s) without angina pectoris: Secondary | ICD-10-CM | POA: Diagnosis not present

## 2016-12-13 DIAGNOSIS — I10 Essential (primary) hypertension: Secondary | ICD-10-CM | POA: Diagnosis not present

## 2016-12-13 DIAGNOSIS — G1221 Amyotrophic lateral sclerosis: Secondary | ICD-10-CM | POA: Diagnosis not present

## 2016-12-13 DIAGNOSIS — I679 Cerebrovascular disease, unspecified: Secondary | ICD-10-CM | POA: Diagnosis not present

## 2016-12-13 DIAGNOSIS — I359 Nonrheumatic aortic valve disorder, unspecified: Secondary | ICD-10-CM | POA: Diagnosis not present

## 2016-12-13 DIAGNOSIS — E785 Hyperlipidemia, unspecified: Secondary | ICD-10-CM | POA: Diagnosis not present

## 2016-12-13 DIAGNOSIS — E46 Unspecified protein-calorie malnutrition: Secondary | ICD-10-CM | POA: Diagnosis not present

## 2016-12-14 DIAGNOSIS — I679 Cerebrovascular disease, unspecified: Secondary | ICD-10-CM | POA: Diagnosis not present

## 2016-12-14 DIAGNOSIS — F0150 Vascular dementia without behavioral disturbance: Secondary | ICD-10-CM | POA: Diagnosis not present

## 2016-12-14 DIAGNOSIS — I359 Nonrheumatic aortic valve disorder, unspecified: Secondary | ICD-10-CM | POA: Diagnosis not present

## 2016-12-14 DIAGNOSIS — I25810 Atherosclerosis of coronary artery bypass graft(s) without angina pectoris: Secondary | ICD-10-CM | POA: Diagnosis not present

## 2016-12-14 DIAGNOSIS — G1221 Amyotrophic lateral sclerosis: Secondary | ICD-10-CM | POA: Diagnosis not present

## 2016-12-14 DIAGNOSIS — I619 Nontraumatic intracerebral hemorrhage, unspecified: Secondary | ICD-10-CM | POA: Diagnosis not present

## 2016-12-17 DIAGNOSIS — F0150 Vascular dementia without behavioral disturbance: Secondary | ICD-10-CM | POA: Diagnosis not present

## 2016-12-17 DIAGNOSIS — G1221 Amyotrophic lateral sclerosis: Secondary | ICD-10-CM | POA: Diagnosis not present

## 2016-12-17 DIAGNOSIS — I679 Cerebrovascular disease, unspecified: Secondary | ICD-10-CM | POA: Diagnosis not present

## 2016-12-17 DIAGNOSIS — I25810 Atherosclerosis of coronary artery bypass graft(s) without angina pectoris: Secondary | ICD-10-CM | POA: Diagnosis not present

## 2016-12-17 DIAGNOSIS — I619 Nontraumatic intracerebral hemorrhage, unspecified: Secondary | ICD-10-CM | POA: Diagnosis not present

## 2016-12-17 DIAGNOSIS — I359 Nonrheumatic aortic valve disorder, unspecified: Secondary | ICD-10-CM | POA: Diagnosis not present

## 2016-12-20 DIAGNOSIS — I359 Nonrheumatic aortic valve disorder, unspecified: Secondary | ICD-10-CM | POA: Diagnosis not present

## 2016-12-20 DIAGNOSIS — I619 Nontraumatic intracerebral hemorrhage, unspecified: Secondary | ICD-10-CM | POA: Diagnosis not present

## 2016-12-20 DIAGNOSIS — I679 Cerebrovascular disease, unspecified: Secondary | ICD-10-CM | POA: Diagnosis not present

## 2016-12-20 DIAGNOSIS — I25810 Atherosclerosis of coronary artery bypass graft(s) without angina pectoris: Secondary | ICD-10-CM | POA: Diagnosis not present

## 2016-12-20 DIAGNOSIS — F0150 Vascular dementia without behavioral disturbance: Secondary | ICD-10-CM | POA: Diagnosis not present

## 2016-12-20 DIAGNOSIS — G1221 Amyotrophic lateral sclerosis: Secondary | ICD-10-CM | POA: Diagnosis not present

## 2016-12-23 DIAGNOSIS — I679 Cerebrovascular disease, unspecified: Secondary | ICD-10-CM | POA: Diagnosis not present

## 2016-12-23 DIAGNOSIS — I359 Nonrheumatic aortic valve disorder, unspecified: Secondary | ICD-10-CM | POA: Diagnosis not present

## 2016-12-23 DIAGNOSIS — G1221 Amyotrophic lateral sclerosis: Secondary | ICD-10-CM | POA: Diagnosis not present

## 2016-12-23 DIAGNOSIS — I619 Nontraumatic intracerebral hemorrhage, unspecified: Secondary | ICD-10-CM | POA: Diagnosis not present

## 2016-12-23 DIAGNOSIS — F0150 Vascular dementia without behavioral disturbance: Secondary | ICD-10-CM | POA: Diagnosis not present

## 2016-12-23 DIAGNOSIS — I25810 Atherosclerosis of coronary artery bypass graft(s) without angina pectoris: Secondary | ICD-10-CM | POA: Diagnosis not present

## 2016-12-28 DIAGNOSIS — I359 Nonrheumatic aortic valve disorder, unspecified: Secondary | ICD-10-CM | POA: Diagnosis not present

## 2016-12-28 DIAGNOSIS — I679 Cerebrovascular disease, unspecified: Secondary | ICD-10-CM | POA: Diagnosis not present

## 2016-12-28 DIAGNOSIS — F0150 Vascular dementia without behavioral disturbance: Secondary | ICD-10-CM | POA: Diagnosis not present

## 2016-12-28 DIAGNOSIS — I619 Nontraumatic intracerebral hemorrhage, unspecified: Secondary | ICD-10-CM | POA: Diagnosis not present

## 2016-12-28 DIAGNOSIS — I10 Essential (primary) hypertension: Secondary | ICD-10-CM | POA: Diagnosis not present

## 2016-12-28 DIAGNOSIS — G1221 Amyotrophic lateral sclerosis: Secondary | ICD-10-CM | POA: Diagnosis not present

## 2016-12-28 DIAGNOSIS — E785 Hyperlipidemia, unspecified: Secondary | ICD-10-CM | POA: Diagnosis not present

## 2016-12-28 DIAGNOSIS — E46 Unspecified protein-calorie malnutrition: Secondary | ICD-10-CM | POA: Diagnosis not present

## 2016-12-28 DIAGNOSIS — I25810 Atherosclerosis of coronary artery bypass graft(s) without angina pectoris: Secondary | ICD-10-CM | POA: Diagnosis not present

## 2016-12-29 DIAGNOSIS — I25810 Atherosclerosis of coronary artery bypass graft(s) without angina pectoris: Secondary | ICD-10-CM | POA: Diagnosis not present

## 2016-12-29 DIAGNOSIS — F0150 Vascular dementia without behavioral disturbance: Secondary | ICD-10-CM | POA: Diagnosis not present

## 2016-12-29 DIAGNOSIS — G1221 Amyotrophic lateral sclerosis: Secondary | ICD-10-CM | POA: Diagnosis not present

## 2016-12-29 DIAGNOSIS — I619 Nontraumatic intracerebral hemorrhage, unspecified: Secondary | ICD-10-CM | POA: Diagnosis not present

## 2016-12-29 DIAGNOSIS — I359 Nonrheumatic aortic valve disorder, unspecified: Secondary | ICD-10-CM | POA: Diagnosis not present

## 2016-12-29 DIAGNOSIS — I679 Cerebrovascular disease, unspecified: Secondary | ICD-10-CM | POA: Diagnosis not present

## 2017-01-05 DIAGNOSIS — I679 Cerebrovascular disease, unspecified: Secondary | ICD-10-CM | POA: Diagnosis not present

## 2017-01-05 DIAGNOSIS — I25810 Atherosclerosis of coronary artery bypass graft(s) without angina pectoris: Secondary | ICD-10-CM | POA: Diagnosis not present

## 2017-01-05 DIAGNOSIS — G1221 Amyotrophic lateral sclerosis: Secondary | ICD-10-CM | POA: Diagnosis not present

## 2017-01-05 DIAGNOSIS — I619 Nontraumatic intracerebral hemorrhage, unspecified: Secondary | ICD-10-CM | POA: Diagnosis not present

## 2017-01-05 DIAGNOSIS — I359 Nonrheumatic aortic valve disorder, unspecified: Secondary | ICD-10-CM | POA: Diagnosis not present

## 2017-01-05 DIAGNOSIS — F0150 Vascular dementia without behavioral disturbance: Secondary | ICD-10-CM | POA: Diagnosis not present

## 2017-01-12 DIAGNOSIS — I679 Cerebrovascular disease, unspecified: Secondary | ICD-10-CM | POA: Diagnosis not present

## 2017-01-12 DIAGNOSIS — G1221 Amyotrophic lateral sclerosis: Secondary | ICD-10-CM | POA: Diagnosis not present

## 2017-01-12 DIAGNOSIS — I359 Nonrheumatic aortic valve disorder, unspecified: Secondary | ICD-10-CM | POA: Diagnosis not present

## 2017-01-12 DIAGNOSIS — F0150 Vascular dementia without behavioral disturbance: Secondary | ICD-10-CM | POA: Diagnosis not present

## 2017-01-12 DIAGNOSIS — I25810 Atherosclerosis of coronary artery bypass graft(s) without angina pectoris: Secondary | ICD-10-CM | POA: Diagnosis not present

## 2017-01-12 DIAGNOSIS — I619 Nontraumatic intracerebral hemorrhage, unspecified: Secondary | ICD-10-CM | POA: Diagnosis not present

## 2017-01-21 DIAGNOSIS — G1221 Amyotrophic lateral sclerosis: Secondary | ICD-10-CM | POA: Diagnosis not present

## 2017-01-21 DIAGNOSIS — I679 Cerebrovascular disease, unspecified: Secondary | ICD-10-CM | POA: Diagnosis not present

## 2017-01-21 DIAGNOSIS — I25810 Atherosclerosis of coronary artery bypass graft(s) without angina pectoris: Secondary | ICD-10-CM | POA: Diagnosis not present

## 2017-01-21 DIAGNOSIS — I359 Nonrheumatic aortic valve disorder, unspecified: Secondary | ICD-10-CM | POA: Diagnosis not present

## 2017-01-21 DIAGNOSIS — F0150 Vascular dementia without behavioral disturbance: Secondary | ICD-10-CM | POA: Diagnosis not present

## 2017-01-21 DIAGNOSIS — I619 Nontraumatic intracerebral hemorrhage, unspecified: Secondary | ICD-10-CM | POA: Diagnosis not present

## 2017-01-28 DIAGNOSIS — I619 Nontraumatic intracerebral hemorrhage, unspecified: Secondary | ICD-10-CM | POA: Diagnosis not present

## 2017-01-28 DIAGNOSIS — I359 Nonrheumatic aortic valve disorder, unspecified: Secondary | ICD-10-CM | POA: Diagnosis not present

## 2017-01-28 DIAGNOSIS — I25810 Atherosclerosis of coronary artery bypass graft(s) without angina pectoris: Secondary | ICD-10-CM | POA: Diagnosis not present

## 2017-01-28 DIAGNOSIS — E46 Unspecified protein-calorie malnutrition: Secondary | ICD-10-CM | POA: Diagnosis not present

## 2017-01-28 DIAGNOSIS — F0150 Vascular dementia without behavioral disturbance: Secondary | ICD-10-CM | POA: Diagnosis not present

## 2017-01-28 DIAGNOSIS — I10 Essential (primary) hypertension: Secondary | ICD-10-CM | POA: Diagnosis not present

## 2017-01-28 DIAGNOSIS — I679 Cerebrovascular disease, unspecified: Secondary | ICD-10-CM | POA: Diagnosis not present

## 2017-01-28 DIAGNOSIS — E785 Hyperlipidemia, unspecified: Secondary | ICD-10-CM | POA: Diagnosis not present

## 2017-01-28 DIAGNOSIS — G1221 Amyotrophic lateral sclerosis: Secondary | ICD-10-CM | POA: Diagnosis not present

## 2017-01-30 DIAGNOSIS — I359 Nonrheumatic aortic valve disorder, unspecified: Secondary | ICD-10-CM | POA: Diagnosis not present

## 2017-01-30 DIAGNOSIS — G1221 Amyotrophic lateral sclerosis: Secondary | ICD-10-CM | POA: Diagnosis not present

## 2017-01-30 DIAGNOSIS — I679 Cerebrovascular disease, unspecified: Secondary | ICD-10-CM | POA: Diagnosis not present

## 2017-01-30 DIAGNOSIS — I619 Nontraumatic intracerebral hemorrhage, unspecified: Secondary | ICD-10-CM | POA: Diagnosis not present

## 2017-01-30 DIAGNOSIS — F0150 Vascular dementia without behavioral disturbance: Secondary | ICD-10-CM | POA: Diagnosis not present

## 2017-01-30 DIAGNOSIS — I25810 Atherosclerosis of coronary artery bypass graft(s) without angina pectoris: Secondary | ICD-10-CM | POA: Diagnosis not present

## 2017-02-01 DIAGNOSIS — F0150 Vascular dementia without behavioral disturbance: Secondary | ICD-10-CM | POA: Diagnosis not present

## 2017-02-01 DIAGNOSIS — I25810 Atherosclerosis of coronary artery bypass graft(s) without angina pectoris: Secondary | ICD-10-CM | POA: Diagnosis not present

## 2017-02-01 DIAGNOSIS — I679 Cerebrovascular disease, unspecified: Secondary | ICD-10-CM | POA: Diagnosis not present

## 2017-02-01 DIAGNOSIS — G1221 Amyotrophic lateral sclerosis: Secondary | ICD-10-CM | POA: Diagnosis not present

## 2017-02-01 DIAGNOSIS — I619 Nontraumatic intracerebral hemorrhage, unspecified: Secondary | ICD-10-CM | POA: Diagnosis not present

## 2017-02-01 DIAGNOSIS — I359 Nonrheumatic aortic valve disorder, unspecified: Secondary | ICD-10-CM | POA: Diagnosis not present

## 2017-02-11 DIAGNOSIS — F0150 Vascular dementia without behavioral disturbance: Secondary | ICD-10-CM | POA: Diagnosis not present

## 2017-02-11 DIAGNOSIS — I25810 Atherosclerosis of coronary artery bypass graft(s) without angina pectoris: Secondary | ICD-10-CM | POA: Diagnosis not present

## 2017-02-11 DIAGNOSIS — G1221 Amyotrophic lateral sclerosis: Secondary | ICD-10-CM | POA: Diagnosis not present

## 2017-02-11 DIAGNOSIS — I619 Nontraumatic intracerebral hemorrhage, unspecified: Secondary | ICD-10-CM | POA: Diagnosis not present

## 2017-02-11 DIAGNOSIS — I359 Nonrheumatic aortic valve disorder, unspecified: Secondary | ICD-10-CM | POA: Diagnosis not present

## 2017-02-11 DIAGNOSIS — I679 Cerebrovascular disease, unspecified: Secondary | ICD-10-CM | POA: Diagnosis not present

## 2017-02-18 DIAGNOSIS — G1221 Amyotrophic lateral sclerosis: Secondary | ICD-10-CM | POA: Diagnosis not present

## 2017-02-18 DIAGNOSIS — I619 Nontraumatic intracerebral hemorrhage, unspecified: Secondary | ICD-10-CM | POA: Diagnosis not present

## 2017-02-18 DIAGNOSIS — F0150 Vascular dementia without behavioral disturbance: Secondary | ICD-10-CM | POA: Diagnosis not present

## 2017-02-18 DIAGNOSIS — I359 Nonrheumatic aortic valve disorder, unspecified: Secondary | ICD-10-CM | POA: Diagnosis not present

## 2017-02-18 DIAGNOSIS — I679 Cerebrovascular disease, unspecified: Secondary | ICD-10-CM | POA: Diagnosis not present

## 2017-02-18 DIAGNOSIS — I25810 Atherosclerosis of coronary artery bypass graft(s) without angina pectoris: Secondary | ICD-10-CM | POA: Diagnosis not present

## 2017-02-22 DIAGNOSIS — I359 Nonrheumatic aortic valve disorder, unspecified: Secondary | ICD-10-CM | POA: Diagnosis not present

## 2017-02-22 DIAGNOSIS — I679 Cerebrovascular disease, unspecified: Secondary | ICD-10-CM | POA: Diagnosis not present

## 2017-02-22 DIAGNOSIS — I619 Nontraumatic intracerebral hemorrhage, unspecified: Secondary | ICD-10-CM | POA: Diagnosis not present

## 2017-02-22 DIAGNOSIS — G1221 Amyotrophic lateral sclerosis: Secondary | ICD-10-CM | POA: Diagnosis not present

## 2017-02-22 DIAGNOSIS — I25810 Atherosclerosis of coronary artery bypass graft(s) without angina pectoris: Secondary | ICD-10-CM | POA: Diagnosis not present

## 2017-02-22 DIAGNOSIS — F0150 Vascular dementia without behavioral disturbance: Secondary | ICD-10-CM | POA: Diagnosis not present

## 2017-02-27 DIAGNOSIS — E46 Unspecified protein-calorie malnutrition: Secondary | ICD-10-CM | POA: Diagnosis not present

## 2017-02-27 DIAGNOSIS — I619 Nontraumatic intracerebral hemorrhage, unspecified: Secondary | ICD-10-CM | POA: Diagnosis not present

## 2017-02-27 DIAGNOSIS — I25810 Atherosclerosis of coronary artery bypass graft(s) without angina pectoris: Secondary | ICD-10-CM | POA: Diagnosis not present

## 2017-02-27 DIAGNOSIS — I10 Essential (primary) hypertension: Secondary | ICD-10-CM | POA: Diagnosis not present

## 2017-02-27 DIAGNOSIS — F0150 Vascular dementia without behavioral disturbance: Secondary | ICD-10-CM | POA: Diagnosis not present

## 2017-02-27 DIAGNOSIS — I359 Nonrheumatic aortic valve disorder, unspecified: Secondary | ICD-10-CM | POA: Diagnosis not present

## 2017-02-27 DIAGNOSIS — E785 Hyperlipidemia, unspecified: Secondary | ICD-10-CM | POA: Diagnosis not present

## 2017-02-27 DIAGNOSIS — G1221 Amyotrophic lateral sclerosis: Secondary | ICD-10-CM | POA: Diagnosis not present

## 2017-02-27 DIAGNOSIS — I679 Cerebrovascular disease, unspecified: Secondary | ICD-10-CM | POA: Diagnosis not present

## 2017-03-01 DIAGNOSIS — I25810 Atherosclerosis of coronary artery bypass graft(s) without angina pectoris: Secondary | ICD-10-CM | POA: Diagnosis not present

## 2017-03-01 DIAGNOSIS — F0150 Vascular dementia without behavioral disturbance: Secondary | ICD-10-CM | POA: Diagnosis not present

## 2017-03-01 DIAGNOSIS — I679 Cerebrovascular disease, unspecified: Secondary | ICD-10-CM | POA: Diagnosis not present

## 2017-03-01 DIAGNOSIS — I359 Nonrheumatic aortic valve disorder, unspecified: Secondary | ICD-10-CM | POA: Diagnosis not present

## 2017-03-01 DIAGNOSIS — I619 Nontraumatic intracerebral hemorrhage, unspecified: Secondary | ICD-10-CM | POA: Diagnosis not present

## 2017-03-01 DIAGNOSIS — G1221 Amyotrophic lateral sclerosis: Secondary | ICD-10-CM | POA: Diagnosis not present

## 2017-03-10 DIAGNOSIS — I359 Nonrheumatic aortic valve disorder, unspecified: Secondary | ICD-10-CM | POA: Diagnosis not present

## 2017-03-10 DIAGNOSIS — F0150 Vascular dementia without behavioral disturbance: Secondary | ICD-10-CM | POA: Diagnosis not present

## 2017-03-10 DIAGNOSIS — I679 Cerebrovascular disease, unspecified: Secondary | ICD-10-CM | POA: Diagnosis not present

## 2017-03-10 DIAGNOSIS — I25810 Atherosclerosis of coronary artery bypass graft(s) without angina pectoris: Secondary | ICD-10-CM | POA: Diagnosis not present

## 2017-03-10 DIAGNOSIS — I619 Nontraumatic intracerebral hemorrhage, unspecified: Secondary | ICD-10-CM | POA: Diagnosis not present

## 2017-03-10 DIAGNOSIS — G1221 Amyotrophic lateral sclerosis: Secondary | ICD-10-CM | POA: Diagnosis not present

## 2017-03-14 DIAGNOSIS — I359 Nonrheumatic aortic valve disorder, unspecified: Secondary | ICD-10-CM | POA: Diagnosis not present

## 2017-03-14 DIAGNOSIS — G1221 Amyotrophic lateral sclerosis: Secondary | ICD-10-CM | POA: Diagnosis not present

## 2017-03-14 DIAGNOSIS — I25810 Atherosclerosis of coronary artery bypass graft(s) without angina pectoris: Secondary | ICD-10-CM | POA: Diagnosis not present

## 2017-03-14 DIAGNOSIS — I679 Cerebrovascular disease, unspecified: Secondary | ICD-10-CM | POA: Diagnosis not present

## 2017-03-14 DIAGNOSIS — I619 Nontraumatic intracerebral hemorrhage, unspecified: Secondary | ICD-10-CM | POA: Diagnosis not present

## 2017-03-14 DIAGNOSIS — F0150 Vascular dementia without behavioral disturbance: Secondary | ICD-10-CM | POA: Diagnosis not present

## 2017-03-21 DIAGNOSIS — I25810 Atherosclerosis of coronary artery bypass graft(s) without angina pectoris: Secondary | ICD-10-CM | POA: Diagnosis not present

## 2017-03-21 DIAGNOSIS — I679 Cerebrovascular disease, unspecified: Secondary | ICD-10-CM | POA: Diagnosis not present

## 2017-03-21 DIAGNOSIS — F0150 Vascular dementia without behavioral disturbance: Secondary | ICD-10-CM | POA: Diagnosis not present

## 2017-03-21 DIAGNOSIS — G1221 Amyotrophic lateral sclerosis: Secondary | ICD-10-CM | POA: Diagnosis not present

## 2017-03-21 DIAGNOSIS — I359 Nonrheumatic aortic valve disorder, unspecified: Secondary | ICD-10-CM | POA: Diagnosis not present

## 2017-03-21 DIAGNOSIS — I619 Nontraumatic intracerebral hemorrhage, unspecified: Secondary | ICD-10-CM | POA: Diagnosis not present

## 2017-03-28 DIAGNOSIS — I25810 Atherosclerosis of coronary artery bypass graft(s) without angina pectoris: Secondary | ICD-10-CM | POA: Diagnosis not present

## 2017-03-28 DIAGNOSIS — I679 Cerebrovascular disease, unspecified: Secondary | ICD-10-CM | POA: Diagnosis not present

## 2017-03-28 DIAGNOSIS — F0150 Vascular dementia without behavioral disturbance: Secondary | ICD-10-CM | POA: Diagnosis not present

## 2017-03-28 DIAGNOSIS — G1221 Amyotrophic lateral sclerosis: Secondary | ICD-10-CM | POA: Diagnosis not present

## 2017-03-28 DIAGNOSIS — I619 Nontraumatic intracerebral hemorrhage, unspecified: Secondary | ICD-10-CM | POA: Diagnosis not present

## 2017-03-28 DIAGNOSIS — I359 Nonrheumatic aortic valve disorder, unspecified: Secondary | ICD-10-CM | POA: Diagnosis not present

## 2017-03-30 DIAGNOSIS — I10 Essential (primary) hypertension: Secondary | ICD-10-CM | POA: Diagnosis not present

## 2017-03-30 DIAGNOSIS — F0150 Vascular dementia without behavioral disturbance: Secondary | ICD-10-CM | POA: Diagnosis not present

## 2017-03-30 DIAGNOSIS — E785 Hyperlipidemia, unspecified: Secondary | ICD-10-CM | POA: Diagnosis not present

## 2017-03-30 DIAGNOSIS — I619 Nontraumatic intracerebral hemorrhage, unspecified: Secondary | ICD-10-CM | POA: Diagnosis not present

## 2017-03-30 DIAGNOSIS — G1221 Amyotrophic lateral sclerosis: Secondary | ICD-10-CM | POA: Diagnosis not present

## 2017-03-30 DIAGNOSIS — I679 Cerebrovascular disease, unspecified: Secondary | ICD-10-CM | POA: Diagnosis not present

## 2017-03-30 DIAGNOSIS — E46 Unspecified protein-calorie malnutrition: Secondary | ICD-10-CM | POA: Diagnosis not present

## 2017-03-30 DIAGNOSIS — I25810 Atherosclerosis of coronary artery bypass graft(s) without angina pectoris: Secondary | ICD-10-CM | POA: Diagnosis not present

## 2017-03-30 DIAGNOSIS — I359 Nonrheumatic aortic valve disorder, unspecified: Secondary | ICD-10-CM | POA: Diagnosis not present

## 2017-04-05 DIAGNOSIS — I359 Nonrheumatic aortic valve disorder, unspecified: Secondary | ICD-10-CM | POA: Diagnosis not present

## 2017-04-05 DIAGNOSIS — G1221 Amyotrophic lateral sclerosis: Secondary | ICD-10-CM | POA: Diagnosis not present

## 2017-04-05 DIAGNOSIS — I25810 Atherosclerosis of coronary artery bypass graft(s) without angina pectoris: Secondary | ICD-10-CM | POA: Diagnosis not present

## 2017-04-05 DIAGNOSIS — I679 Cerebrovascular disease, unspecified: Secondary | ICD-10-CM | POA: Diagnosis not present

## 2017-04-05 DIAGNOSIS — I619 Nontraumatic intracerebral hemorrhage, unspecified: Secondary | ICD-10-CM | POA: Diagnosis not present

## 2017-04-05 DIAGNOSIS — F0150 Vascular dementia without behavioral disturbance: Secondary | ICD-10-CM | POA: Diagnosis not present

## 2017-04-09 DIAGNOSIS — G1221 Amyotrophic lateral sclerosis: Secondary | ICD-10-CM | POA: Diagnosis not present

## 2017-04-09 DIAGNOSIS — I679 Cerebrovascular disease, unspecified: Secondary | ICD-10-CM | POA: Diagnosis not present

## 2017-04-09 DIAGNOSIS — F0150 Vascular dementia without behavioral disturbance: Secondary | ICD-10-CM | POA: Diagnosis not present

## 2017-04-09 DIAGNOSIS — I25810 Atherosclerosis of coronary artery bypass graft(s) without angina pectoris: Secondary | ICD-10-CM | POA: Diagnosis not present

## 2017-04-09 DIAGNOSIS — I619 Nontraumatic intracerebral hemorrhage, unspecified: Secondary | ICD-10-CM | POA: Diagnosis not present

## 2017-04-09 DIAGNOSIS — I359 Nonrheumatic aortic valve disorder, unspecified: Secondary | ICD-10-CM | POA: Diagnosis not present

## 2017-04-12 DIAGNOSIS — I25810 Atherosclerosis of coronary artery bypass graft(s) without angina pectoris: Secondary | ICD-10-CM | POA: Diagnosis not present

## 2017-04-12 DIAGNOSIS — I359 Nonrheumatic aortic valve disorder, unspecified: Secondary | ICD-10-CM | POA: Diagnosis not present

## 2017-04-12 DIAGNOSIS — G1221 Amyotrophic lateral sclerosis: Secondary | ICD-10-CM | POA: Diagnosis not present

## 2017-04-12 DIAGNOSIS — I619 Nontraumatic intracerebral hemorrhage, unspecified: Secondary | ICD-10-CM | POA: Diagnosis not present

## 2017-04-12 DIAGNOSIS — F0150 Vascular dementia without behavioral disturbance: Secondary | ICD-10-CM | POA: Diagnosis not present

## 2017-04-12 DIAGNOSIS — I679 Cerebrovascular disease, unspecified: Secondary | ICD-10-CM | POA: Diagnosis not present

## 2017-04-13 DIAGNOSIS — I629 Nontraumatic intracranial hemorrhage, unspecified: Secondary | ICD-10-CM | POA: Diagnosis not present

## 2017-04-13 DIAGNOSIS — I350 Nonrheumatic aortic (valve) stenosis: Secondary | ICD-10-CM | POA: Diagnosis not present

## 2017-04-13 DIAGNOSIS — R296 Repeated falls: Secondary | ICD-10-CM | POA: Diagnosis not present

## 2017-04-13 DIAGNOSIS — I10 Essential (primary) hypertension: Secondary | ICD-10-CM | POA: Diagnosis not present

## 2017-04-13 DIAGNOSIS — R627 Adult failure to thrive: Secondary | ICD-10-CM | POA: Diagnosis not present

## 2017-04-13 DIAGNOSIS — I2510 Atherosclerotic heart disease of native coronary artery without angina pectoris: Secondary | ICD-10-CM | POA: Diagnosis not present

## 2017-04-13 DIAGNOSIS — Z8701 Personal history of pneumonia (recurrent): Secondary | ICD-10-CM | POA: Diagnosis not present

## 2017-04-13 DIAGNOSIS — R2681 Unsteadiness on feet: Secondary | ICD-10-CM | POA: Diagnosis not present

## 2017-04-13 DIAGNOSIS — Z23 Encounter for immunization: Secondary | ICD-10-CM | POA: Diagnosis not present

## 2017-04-13 DIAGNOSIS — R636 Underweight: Secondary | ICD-10-CM | POA: Diagnosis not present

## 2017-04-13 DIAGNOSIS — E440 Moderate protein-calorie malnutrition: Secondary | ICD-10-CM | POA: Diagnosis not present

## 2017-04-15 DIAGNOSIS — G1221 Amyotrophic lateral sclerosis: Secondary | ICD-10-CM | POA: Diagnosis not present

## 2017-04-15 DIAGNOSIS — I359 Nonrheumatic aortic valve disorder, unspecified: Secondary | ICD-10-CM | POA: Diagnosis not present

## 2017-04-15 DIAGNOSIS — I679 Cerebrovascular disease, unspecified: Secondary | ICD-10-CM | POA: Diagnosis not present

## 2017-04-15 DIAGNOSIS — F0150 Vascular dementia without behavioral disturbance: Secondary | ICD-10-CM | POA: Diagnosis not present

## 2017-04-15 DIAGNOSIS — I619 Nontraumatic intracerebral hemorrhage, unspecified: Secondary | ICD-10-CM | POA: Diagnosis not present

## 2017-04-15 DIAGNOSIS — I25810 Atherosclerosis of coronary artery bypass graft(s) without angina pectoris: Secondary | ICD-10-CM | POA: Diagnosis not present

## 2017-04-16 DIAGNOSIS — I25810 Atherosclerosis of coronary artery bypass graft(s) without angina pectoris: Secondary | ICD-10-CM | POA: Diagnosis not present

## 2017-04-16 DIAGNOSIS — F0150 Vascular dementia without behavioral disturbance: Secondary | ICD-10-CM | POA: Diagnosis not present

## 2017-04-16 DIAGNOSIS — I679 Cerebrovascular disease, unspecified: Secondary | ICD-10-CM | POA: Diagnosis not present

## 2017-04-16 DIAGNOSIS — I359 Nonrheumatic aortic valve disorder, unspecified: Secondary | ICD-10-CM | POA: Diagnosis not present

## 2017-04-16 DIAGNOSIS — I619 Nontraumatic intracerebral hemorrhage, unspecified: Secondary | ICD-10-CM | POA: Diagnosis not present

## 2017-04-16 DIAGNOSIS — G1221 Amyotrophic lateral sclerosis: Secondary | ICD-10-CM | POA: Diagnosis not present

## 2017-04-18 DIAGNOSIS — I619 Nontraumatic intracerebral hemorrhage, unspecified: Secondary | ICD-10-CM | POA: Diagnosis not present

## 2017-04-18 DIAGNOSIS — I679 Cerebrovascular disease, unspecified: Secondary | ICD-10-CM | POA: Diagnosis not present

## 2017-04-18 DIAGNOSIS — G1221 Amyotrophic lateral sclerosis: Secondary | ICD-10-CM | POA: Diagnosis not present

## 2017-04-18 DIAGNOSIS — I359 Nonrheumatic aortic valve disorder, unspecified: Secondary | ICD-10-CM | POA: Diagnosis not present

## 2017-04-18 DIAGNOSIS — F0150 Vascular dementia without behavioral disturbance: Secondary | ICD-10-CM | POA: Diagnosis not present

## 2017-04-18 DIAGNOSIS — I25810 Atherosclerosis of coronary artery bypass graft(s) without angina pectoris: Secondary | ICD-10-CM | POA: Diagnosis not present

## 2017-04-19 DIAGNOSIS — G1221 Amyotrophic lateral sclerosis: Secondary | ICD-10-CM | POA: Diagnosis not present

## 2017-04-19 DIAGNOSIS — I25810 Atherosclerosis of coronary artery bypass graft(s) without angina pectoris: Secondary | ICD-10-CM | POA: Diagnosis not present

## 2017-04-19 DIAGNOSIS — F0150 Vascular dementia without behavioral disturbance: Secondary | ICD-10-CM | POA: Diagnosis not present

## 2017-04-19 DIAGNOSIS — I619 Nontraumatic intracerebral hemorrhage, unspecified: Secondary | ICD-10-CM | POA: Diagnosis not present

## 2017-04-19 DIAGNOSIS — I359 Nonrheumatic aortic valve disorder, unspecified: Secondary | ICD-10-CM | POA: Diagnosis not present

## 2017-04-19 DIAGNOSIS — I679 Cerebrovascular disease, unspecified: Secondary | ICD-10-CM | POA: Diagnosis not present

## 2017-04-21 DIAGNOSIS — I359 Nonrheumatic aortic valve disorder, unspecified: Secondary | ICD-10-CM | POA: Diagnosis not present

## 2017-04-21 DIAGNOSIS — I619 Nontraumatic intracerebral hemorrhage, unspecified: Secondary | ICD-10-CM | POA: Diagnosis not present

## 2017-04-21 DIAGNOSIS — F0150 Vascular dementia without behavioral disturbance: Secondary | ICD-10-CM | POA: Diagnosis not present

## 2017-04-21 DIAGNOSIS — I679 Cerebrovascular disease, unspecified: Secondary | ICD-10-CM | POA: Diagnosis not present

## 2017-04-21 DIAGNOSIS — G1221 Amyotrophic lateral sclerosis: Secondary | ICD-10-CM | POA: Diagnosis not present

## 2017-04-21 DIAGNOSIS — I25810 Atherosclerosis of coronary artery bypass graft(s) without angina pectoris: Secondary | ICD-10-CM | POA: Diagnosis not present

## 2017-04-27 DIAGNOSIS — I25810 Atherosclerosis of coronary artery bypass graft(s) without angina pectoris: Secondary | ICD-10-CM | POA: Diagnosis not present

## 2017-04-27 DIAGNOSIS — G1221 Amyotrophic lateral sclerosis: Secondary | ICD-10-CM | POA: Diagnosis not present

## 2017-04-27 DIAGNOSIS — I619 Nontraumatic intracerebral hemorrhage, unspecified: Secondary | ICD-10-CM | POA: Diagnosis not present

## 2017-04-27 DIAGNOSIS — F0150 Vascular dementia without behavioral disturbance: Secondary | ICD-10-CM | POA: Diagnosis not present

## 2017-04-27 DIAGNOSIS — I359 Nonrheumatic aortic valve disorder, unspecified: Secondary | ICD-10-CM | POA: Diagnosis not present

## 2017-04-27 DIAGNOSIS — I679 Cerebrovascular disease, unspecified: Secondary | ICD-10-CM | POA: Diagnosis not present

## 2017-04-30 DIAGNOSIS — I10 Essential (primary) hypertension: Secondary | ICD-10-CM | POA: Diagnosis not present

## 2017-04-30 DIAGNOSIS — I619 Nontraumatic intracerebral hemorrhage, unspecified: Secondary | ICD-10-CM | POA: Diagnosis not present

## 2017-04-30 DIAGNOSIS — F0150 Vascular dementia without behavioral disturbance: Secondary | ICD-10-CM | POA: Diagnosis not present

## 2017-04-30 DIAGNOSIS — I359 Nonrheumatic aortic valve disorder, unspecified: Secondary | ICD-10-CM | POA: Diagnosis not present

## 2017-04-30 DIAGNOSIS — I25810 Atherosclerosis of coronary artery bypass graft(s) without angina pectoris: Secondary | ICD-10-CM | POA: Diagnosis not present

## 2017-04-30 DIAGNOSIS — G1221 Amyotrophic lateral sclerosis: Secondary | ICD-10-CM | POA: Diagnosis not present

## 2017-04-30 DIAGNOSIS — I679 Cerebrovascular disease, unspecified: Secondary | ICD-10-CM | POA: Diagnosis not present

## 2017-04-30 DIAGNOSIS — E785 Hyperlipidemia, unspecified: Secondary | ICD-10-CM | POA: Diagnosis not present

## 2017-04-30 DIAGNOSIS — E46 Unspecified protein-calorie malnutrition: Secondary | ICD-10-CM | POA: Diagnosis not present

## 2017-05-04 DIAGNOSIS — I679 Cerebrovascular disease, unspecified: Secondary | ICD-10-CM | POA: Diagnosis not present

## 2017-05-04 DIAGNOSIS — I359 Nonrheumatic aortic valve disorder, unspecified: Secondary | ICD-10-CM | POA: Diagnosis not present

## 2017-05-04 DIAGNOSIS — I25810 Atherosclerosis of coronary artery bypass graft(s) without angina pectoris: Secondary | ICD-10-CM | POA: Diagnosis not present

## 2017-05-04 DIAGNOSIS — G1221 Amyotrophic lateral sclerosis: Secondary | ICD-10-CM | POA: Diagnosis not present

## 2017-05-04 DIAGNOSIS — I619 Nontraumatic intracerebral hemorrhage, unspecified: Secondary | ICD-10-CM | POA: Diagnosis not present

## 2017-05-04 DIAGNOSIS — F0150 Vascular dementia without behavioral disturbance: Secondary | ICD-10-CM | POA: Diagnosis not present

## 2017-05-11 DIAGNOSIS — G1221 Amyotrophic lateral sclerosis: Secondary | ICD-10-CM | POA: Diagnosis not present

## 2017-05-11 DIAGNOSIS — I25810 Atherosclerosis of coronary artery bypass graft(s) without angina pectoris: Secondary | ICD-10-CM | POA: Diagnosis not present

## 2017-05-11 DIAGNOSIS — I359 Nonrheumatic aortic valve disorder, unspecified: Secondary | ICD-10-CM | POA: Diagnosis not present

## 2017-05-11 DIAGNOSIS — I679 Cerebrovascular disease, unspecified: Secondary | ICD-10-CM | POA: Diagnosis not present

## 2017-05-11 DIAGNOSIS — F0150 Vascular dementia without behavioral disturbance: Secondary | ICD-10-CM | POA: Diagnosis not present

## 2017-05-11 DIAGNOSIS — I619 Nontraumatic intracerebral hemorrhage, unspecified: Secondary | ICD-10-CM | POA: Diagnosis not present

## 2017-05-17 DIAGNOSIS — I619 Nontraumatic intracerebral hemorrhage, unspecified: Secondary | ICD-10-CM | POA: Diagnosis not present

## 2017-05-17 DIAGNOSIS — I679 Cerebrovascular disease, unspecified: Secondary | ICD-10-CM | POA: Diagnosis not present

## 2017-05-17 DIAGNOSIS — F0150 Vascular dementia without behavioral disturbance: Secondary | ICD-10-CM | POA: Diagnosis not present

## 2017-05-17 DIAGNOSIS — I359 Nonrheumatic aortic valve disorder, unspecified: Secondary | ICD-10-CM | POA: Diagnosis not present

## 2017-05-17 DIAGNOSIS — I25810 Atherosclerosis of coronary artery bypass graft(s) without angina pectoris: Secondary | ICD-10-CM | POA: Diagnosis not present

## 2017-05-17 DIAGNOSIS — G1221 Amyotrophic lateral sclerosis: Secondary | ICD-10-CM | POA: Diagnosis not present

## 2017-05-18 DIAGNOSIS — I25810 Atherosclerosis of coronary artery bypass graft(s) without angina pectoris: Secondary | ICD-10-CM | POA: Diagnosis not present

## 2017-05-18 DIAGNOSIS — I359 Nonrheumatic aortic valve disorder, unspecified: Secondary | ICD-10-CM | POA: Diagnosis not present

## 2017-05-18 DIAGNOSIS — I679 Cerebrovascular disease, unspecified: Secondary | ICD-10-CM | POA: Diagnosis not present

## 2017-05-18 DIAGNOSIS — F0150 Vascular dementia without behavioral disturbance: Secondary | ICD-10-CM | POA: Diagnosis not present

## 2017-05-18 DIAGNOSIS — I619 Nontraumatic intracerebral hemorrhage, unspecified: Secondary | ICD-10-CM | POA: Diagnosis not present

## 2017-05-18 DIAGNOSIS — G1221 Amyotrophic lateral sclerosis: Secondary | ICD-10-CM | POA: Diagnosis not present

## 2017-05-18 IMAGING — CT CT ANGIO CHEST
2 of 6 series · 18 of 36 positions shown · IV contrast (Omni 300)
Comparison: Chest CT January 13, 2015; chest radiograph August 30, 2016

CLINICAL DATA: Shortness of breath and cough

EXAM:
CT ANGIOGRAPHY CHEST WITH CONTRAST
TECHNIQUE: Multidetector CT imaging of the chest was performed using the
standard protocol during bolus administration of intravenous
contrast. Multiplanar CT image reconstructions and MIPs were
obtained to evaluate the vascular anatomy.
CONTRAST:  100 mL Isovue 370 nonionic

[Series 6: pe thins · axial · 0.62mm/px · z∈[+1058,+1281]mm · 17 of 251 slices shown]
[im 14/251  lung]
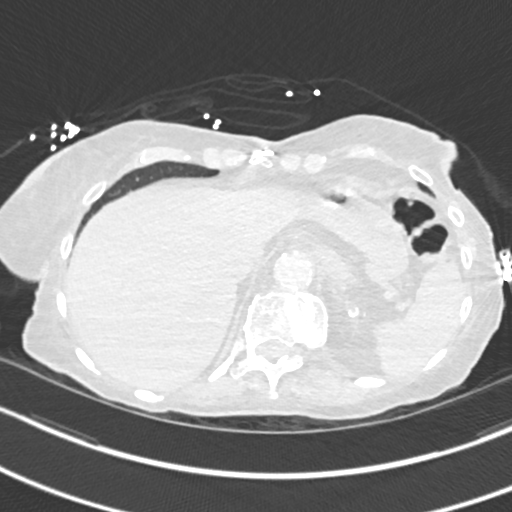
[im 28/251  mediastinal]
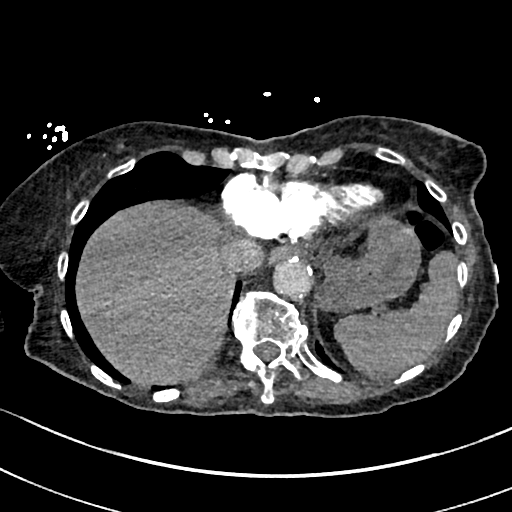
[im 42/251  lung]
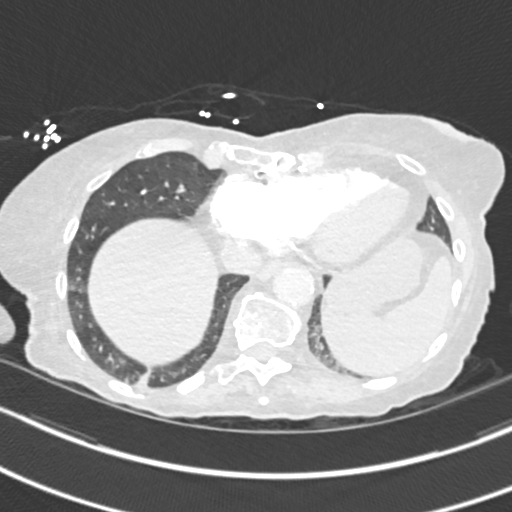
[im 56/251  mediastinal]
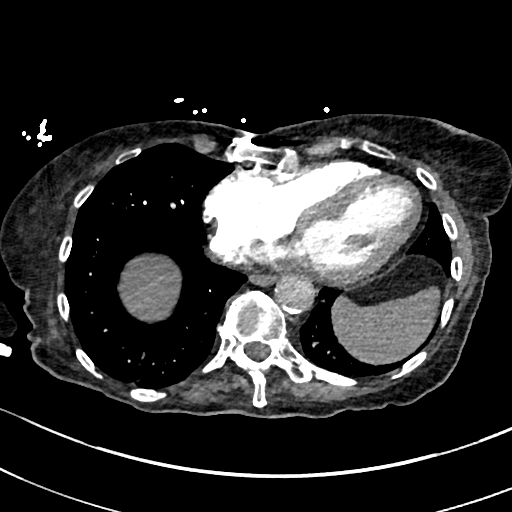
[im 70/251  lung]
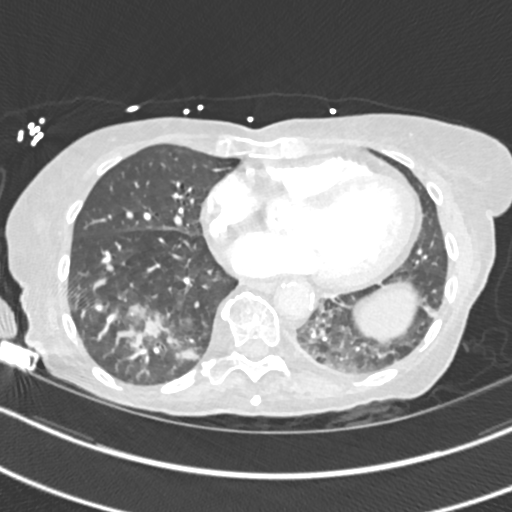
[im 84/251  mediastinal]
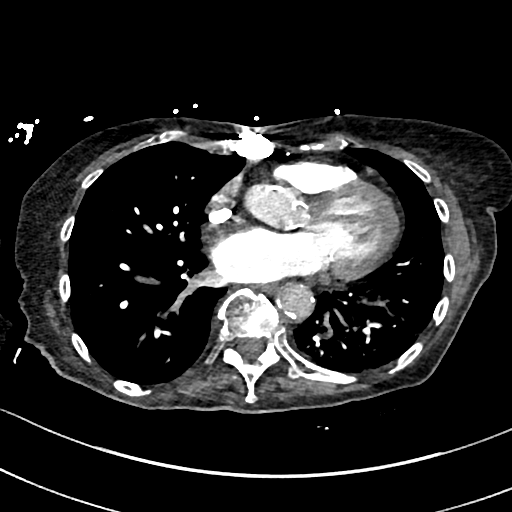
[im 98/251  lung]
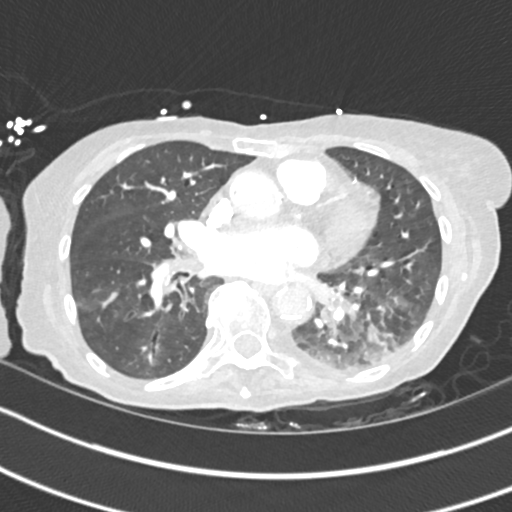
[im 112/251  mediastinal]
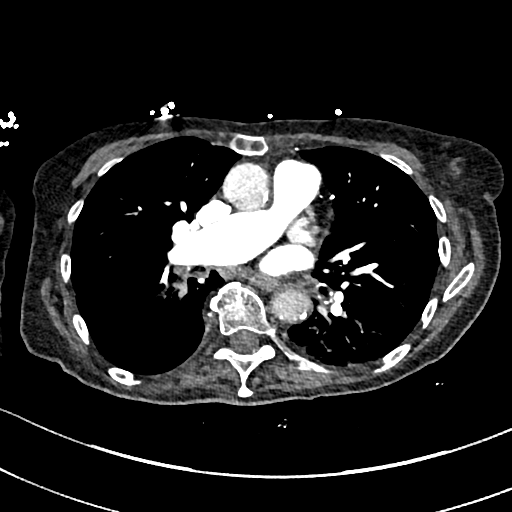
[im 126/251  lung]
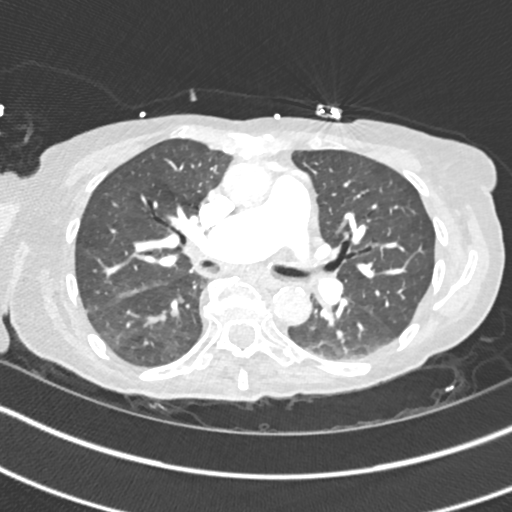
[im 139/251  mediastinal]
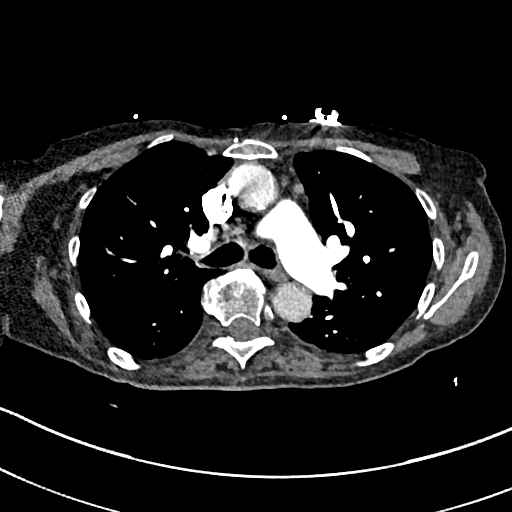
[im 153/251  lung]
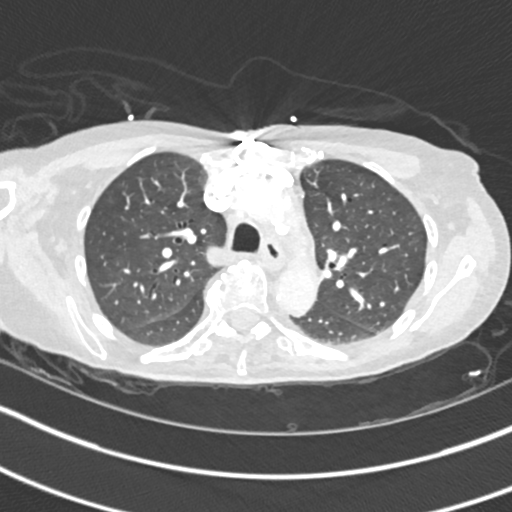
[im 167/251  mediastinal]
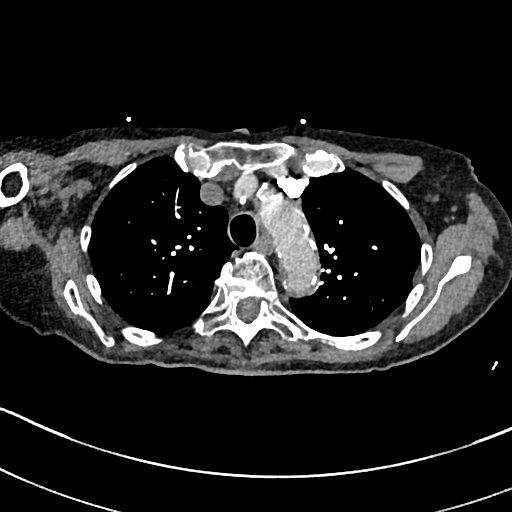
[im 181/251  lung]
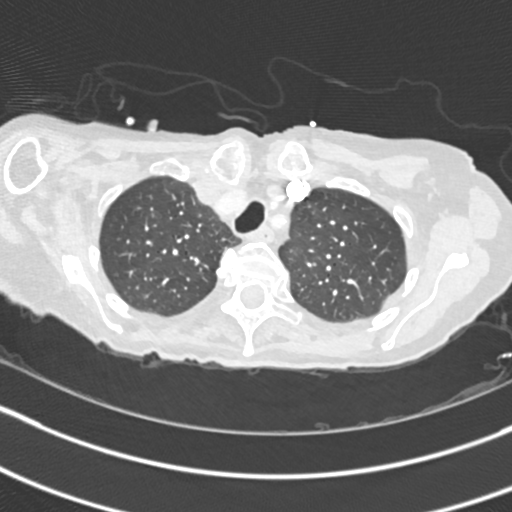
[im 195/251  mediastinal]
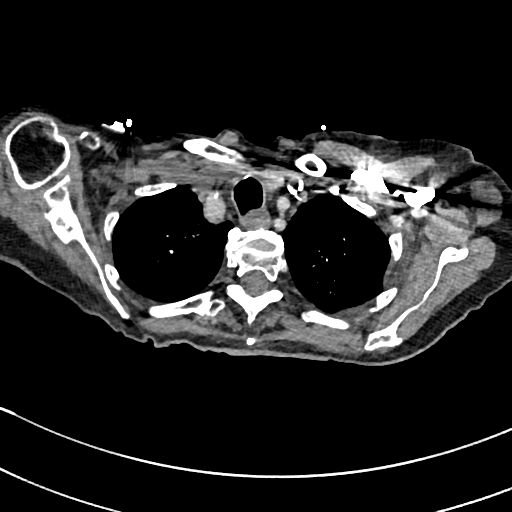
[im 209/251  lung]
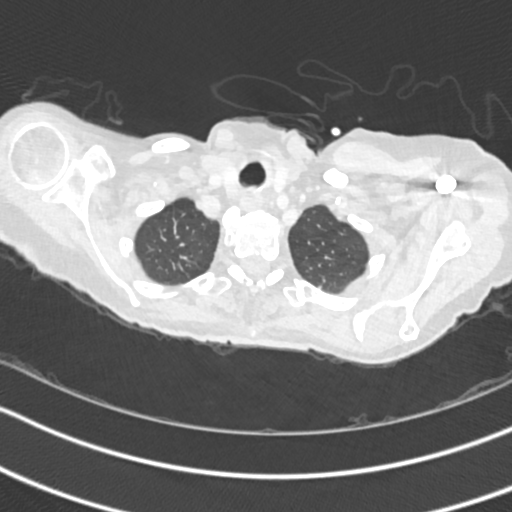
[im 223/251  mediastinal]
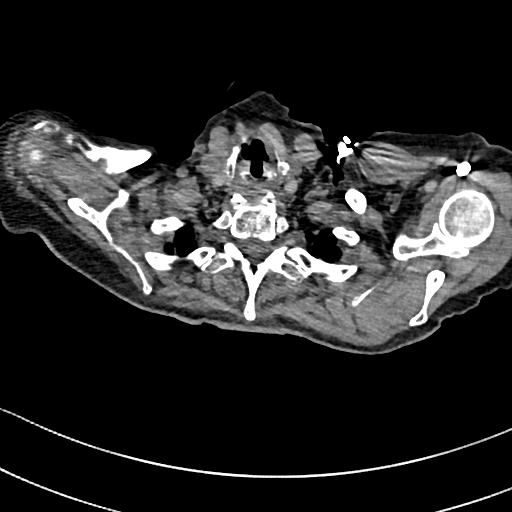
[im 237/251  lung]
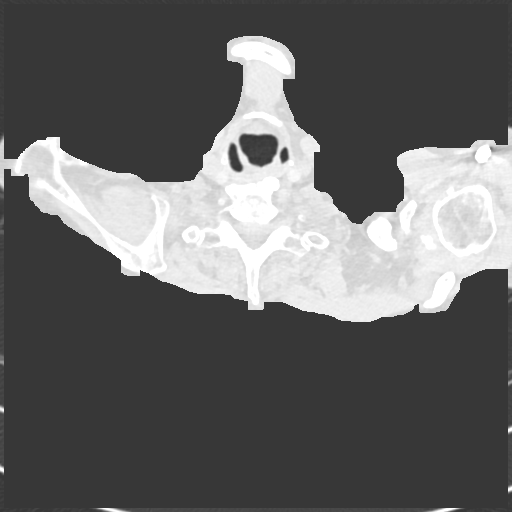

[Series 7: pe 2mm cor · coronal · 0.48mm/px · 1 of 98 slices shown]
[im 49/98  mediastinal]
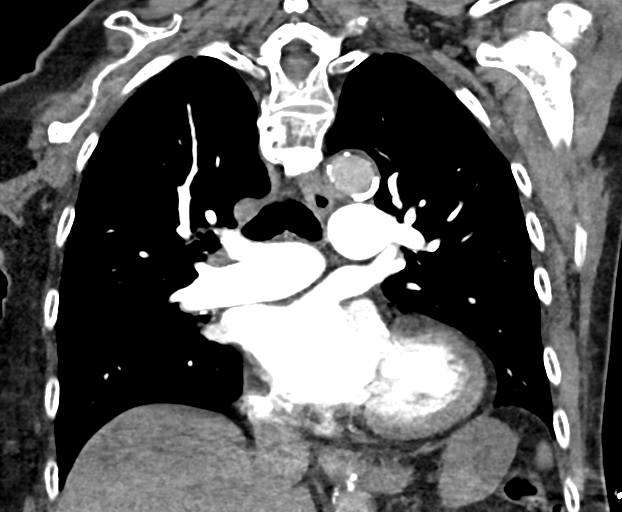

[18 of 36 positions shown; findings below may reference images not displayed]

FINDINGS: Cardiovascular: There is no demonstrable pulmonary embolus. There is
no appreciable thoracic aortic aneurysm or dissection. There are
foci of calcification throughout the proximal great vessels
bilaterally. There is calcification throughout the aorta. There are
foci of coronary artery calcification. Pericardium is not
appreciably thickened.

Mediastinum/Nodes: Thyroid appears normal. There are scattered
subcentimeter mediastinal lymph nodes bilaterally. By size criteria,
there is no adenopathy in the thoracic region.

Lungs/Pleura: On axial slice 47 series 5, there is a 5 mm nodular
opacity in the posterior segment of the right upper lobe. There is
patchy airspace consolidation in both lower lobes, felt to represent
bilateral lower lobe pneumonia. There is slight upper lobe
bronchiectatic change bilaterally. There is no appreciable pleural
effusion or pleural thickening.

Upper Abdomen: In the visualized upper abdomen, there is
atherosclerotic calcification in the aorta. There appears to be mild
adrenal hypertrophy in the incompletely visualized left adrenal.

Musculoskeletal: Patient is status post median sternotomy. There is
degenerative change in the thoracic spine. No blastic or lytic bone
lesions are appreciable. There is degenerative change in each
shoulder.

Review of the MIP images confirms the above findings.
IMPRESSION: No demonstrable pulmonary embolus.

Patchy infiltrate most consistent with pneumonia in each lower lobe.
New 5 mm nodular opacity posterior segment right upper lobe. No
follow-up needed if patient is low-risk. Non-contrast chest CT can
be considered in 12 months if patient is high-risk. This
recommendation follows the consensus statement: Guidelines for
Management of Incidental Pulmonary Nodules Detected on CT Images:

No evident adenopathy by size criteria.

Extensive atherosclerotic calcification as well as foci of coronary
artery calcification.

Followup PA and lateral chest radiographs recommended in 3-4 weeks
following trial of antibiotic therapy to ensure resolution and
exclude underlying malignancy.

## 2017-05-23 DIAGNOSIS — G1221 Amyotrophic lateral sclerosis: Secondary | ICD-10-CM | POA: Diagnosis not present

## 2017-05-23 DIAGNOSIS — I25810 Atherosclerosis of coronary artery bypass graft(s) without angina pectoris: Secondary | ICD-10-CM | POA: Diagnosis not present

## 2017-05-23 DIAGNOSIS — I359 Nonrheumatic aortic valve disorder, unspecified: Secondary | ICD-10-CM | POA: Diagnosis not present

## 2017-05-23 DIAGNOSIS — I619 Nontraumatic intracerebral hemorrhage, unspecified: Secondary | ICD-10-CM | POA: Diagnosis not present

## 2017-05-23 DIAGNOSIS — F0150 Vascular dementia without behavioral disturbance: Secondary | ICD-10-CM | POA: Diagnosis not present

## 2017-05-23 DIAGNOSIS — I679 Cerebrovascular disease, unspecified: Secondary | ICD-10-CM | POA: Diagnosis not present

## 2017-05-24 DIAGNOSIS — I619 Nontraumatic intracerebral hemorrhage, unspecified: Secondary | ICD-10-CM | POA: Diagnosis not present

## 2017-05-24 DIAGNOSIS — I25810 Atherosclerosis of coronary artery bypass graft(s) without angina pectoris: Secondary | ICD-10-CM | POA: Diagnosis not present

## 2017-05-24 DIAGNOSIS — I679 Cerebrovascular disease, unspecified: Secondary | ICD-10-CM | POA: Diagnosis not present

## 2017-05-24 DIAGNOSIS — I359 Nonrheumatic aortic valve disorder, unspecified: Secondary | ICD-10-CM | POA: Diagnosis not present

## 2017-05-24 DIAGNOSIS — F0150 Vascular dementia without behavioral disturbance: Secondary | ICD-10-CM | POA: Diagnosis not present

## 2017-05-24 DIAGNOSIS — G1221 Amyotrophic lateral sclerosis: Secondary | ICD-10-CM | POA: Diagnosis not present

## 2017-05-30 DIAGNOSIS — I10 Essential (primary) hypertension: Secondary | ICD-10-CM | POA: Diagnosis not present

## 2017-05-30 DIAGNOSIS — G1221 Amyotrophic lateral sclerosis: Secondary | ICD-10-CM | POA: Diagnosis not present

## 2017-05-30 DIAGNOSIS — I359 Nonrheumatic aortic valve disorder, unspecified: Secondary | ICD-10-CM | POA: Diagnosis not present

## 2017-05-30 DIAGNOSIS — E785 Hyperlipidemia, unspecified: Secondary | ICD-10-CM | POA: Diagnosis not present

## 2017-05-30 DIAGNOSIS — E46 Unspecified protein-calorie malnutrition: Secondary | ICD-10-CM | POA: Diagnosis not present

## 2017-05-30 DIAGNOSIS — I679 Cerebrovascular disease, unspecified: Secondary | ICD-10-CM | POA: Diagnosis not present

## 2017-05-30 DIAGNOSIS — F0150 Vascular dementia without behavioral disturbance: Secondary | ICD-10-CM | POA: Diagnosis not present

## 2017-05-30 DIAGNOSIS — I619 Nontraumatic intracerebral hemorrhage, unspecified: Secondary | ICD-10-CM | POA: Diagnosis not present

## 2017-05-30 DIAGNOSIS — I25810 Atherosclerosis of coronary artery bypass graft(s) without angina pectoris: Secondary | ICD-10-CM | POA: Diagnosis not present

## 2017-06-01 DIAGNOSIS — G1221 Amyotrophic lateral sclerosis: Secondary | ICD-10-CM | POA: Diagnosis not present

## 2017-06-01 DIAGNOSIS — I619 Nontraumatic intracerebral hemorrhage, unspecified: Secondary | ICD-10-CM | POA: Diagnosis not present

## 2017-06-01 DIAGNOSIS — I25810 Atherosclerosis of coronary artery bypass graft(s) without angina pectoris: Secondary | ICD-10-CM | POA: Diagnosis not present

## 2017-06-01 DIAGNOSIS — I359 Nonrheumatic aortic valve disorder, unspecified: Secondary | ICD-10-CM | POA: Diagnosis not present

## 2017-06-01 DIAGNOSIS — I679 Cerebrovascular disease, unspecified: Secondary | ICD-10-CM | POA: Diagnosis not present

## 2017-06-01 DIAGNOSIS — F0150 Vascular dementia without behavioral disturbance: Secondary | ICD-10-CM | POA: Diagnosis not present

## 2017-06-04 DIAGNOSIS — I679 Cerebrovascular disease, unspecified: Secondary | ICD-10-CM | POA: Diagnosis not present

## 2017-06-04 DIAGNOSIS — I359 Nonrheumatic aortic valve disorder, unspecified: Secondary | ICD-10-CM | POA: Diagnosis not present

## 2017-06-04 DIAGNOSIS — G1221 Amyotrophic lateral sclerosis: Secondary | ICD-10-CM | POA: Diagnosis not present

## 2017-06-04 DIAGNOSIS — I619 Nontraumatic intracerebral hemorrhage, unspecified: Secondary | ICD-10-CM | POA: Diagnosis not present

## 2017-06-04 DIAGNOSIS — I25810 Atherosclerosis of coronary artery bypass graft(s) without angina pectoris: Secondary | ICD-10-CM | POA: Diagnosis not present

## 2017-06-04 DIAGNOSIS — F0150 Vascular dementia without behavioral disturbance: Secondary | ICD-10-CM | POA: Diagnosis not present

## 2017-06-05 DIAGNOSIS — I679 Cerebrovascular disease, unspecified: Secondary | ICD-10-CM | POA: Diagnosis not present

## 2017-06-05 DIAGNOSIS — I359 Nonrheumatic aortic valve disorder, unspecified: Secondary | ICD-10-CM | POA: Diagnosis not present

## 2017-06-05 DIAGNOSIS — I25810 Atherosclerosis of coronary artery bypass graft(s) without angina pectoris: Secondary | ICD-10-CM | POA: Diagnosis not present

## 2017-06-05 DIAGNOSIS — G1221 Amyotrophic lateral sclerosis: Secondary | ICD-10-CM | POA: Diagnosis not present

## 2017-06-05 DIAGNOSIS — F0150 Vascular dementia without behavioral disturbance: Secondary | ICD-10-CM | POA: Diagnosis not present

## 2017-06-05 DIAGNOSIS — I619 Nontraumatic intracerebral hemorrhage, unspecified: Secondary | ICD-10-CM | POA: Diagnosis not present

## 2017-06-30 DEATH — deceased

## 2017-08-26 IMAGING — CR DG HAND COMPLETE 3+V*R*
3 series · 3 of 3 positions shown · non-contrast
Comparison: None.

CLINICAL DATA: Pain following fall

EXAM:
RIGHT HAND - COMPLETE 3+ VIEW

[hand pa]
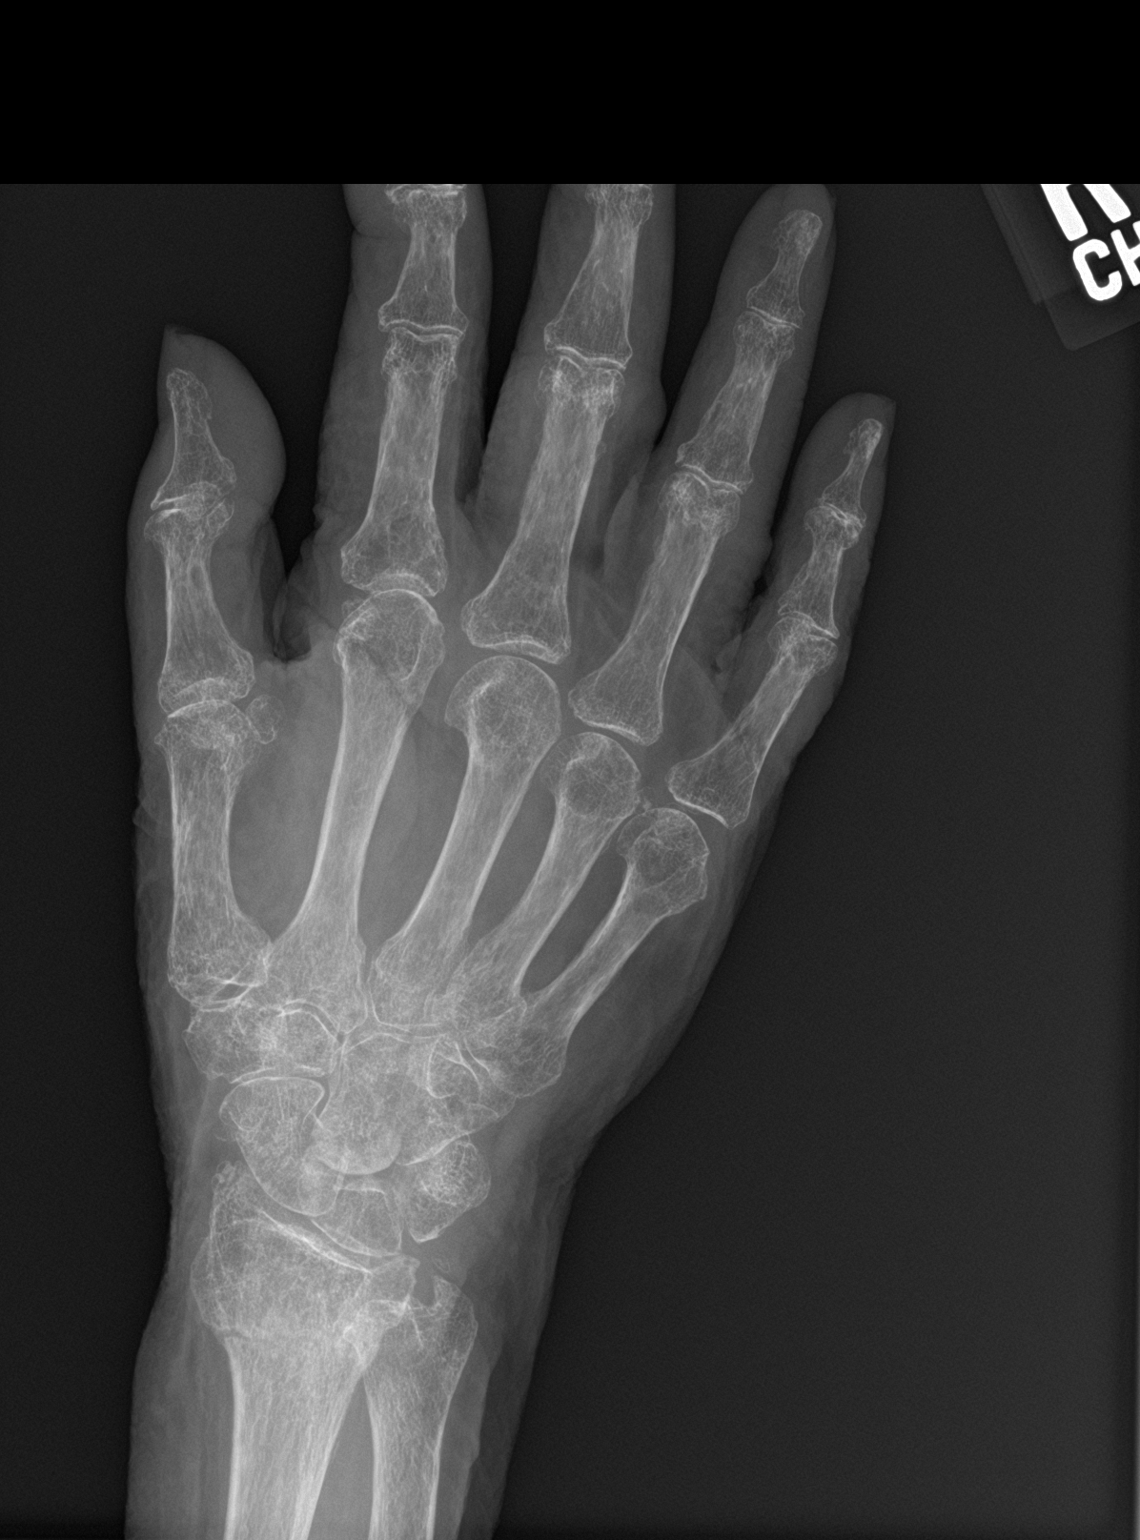

[hand obl]
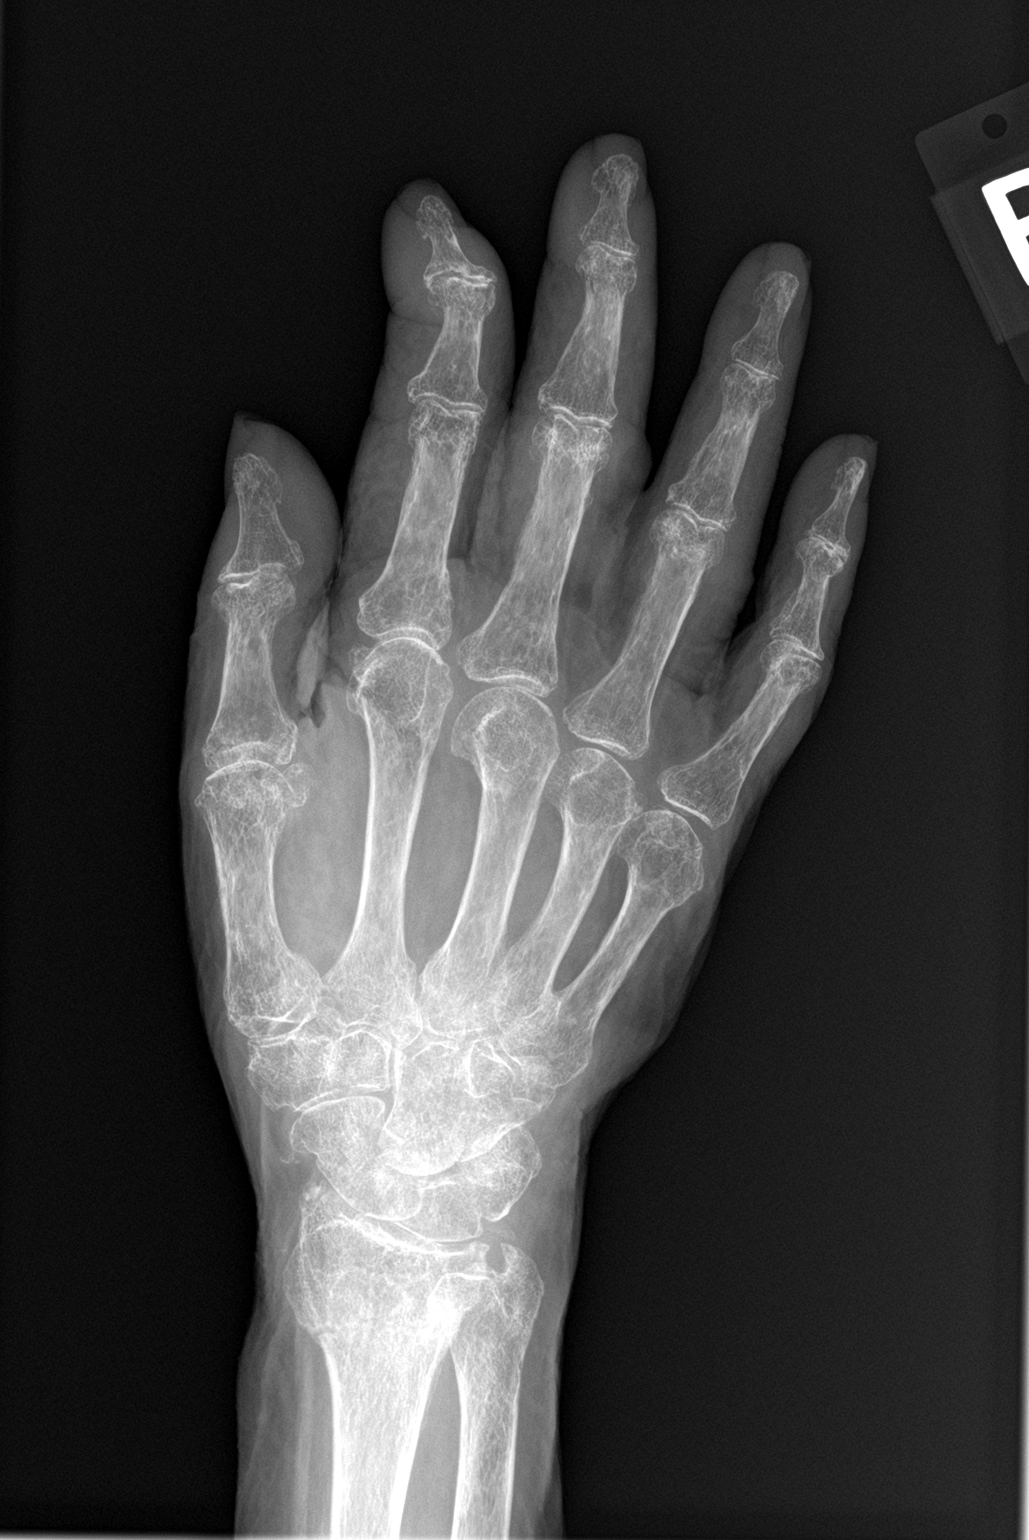

[hand lat]
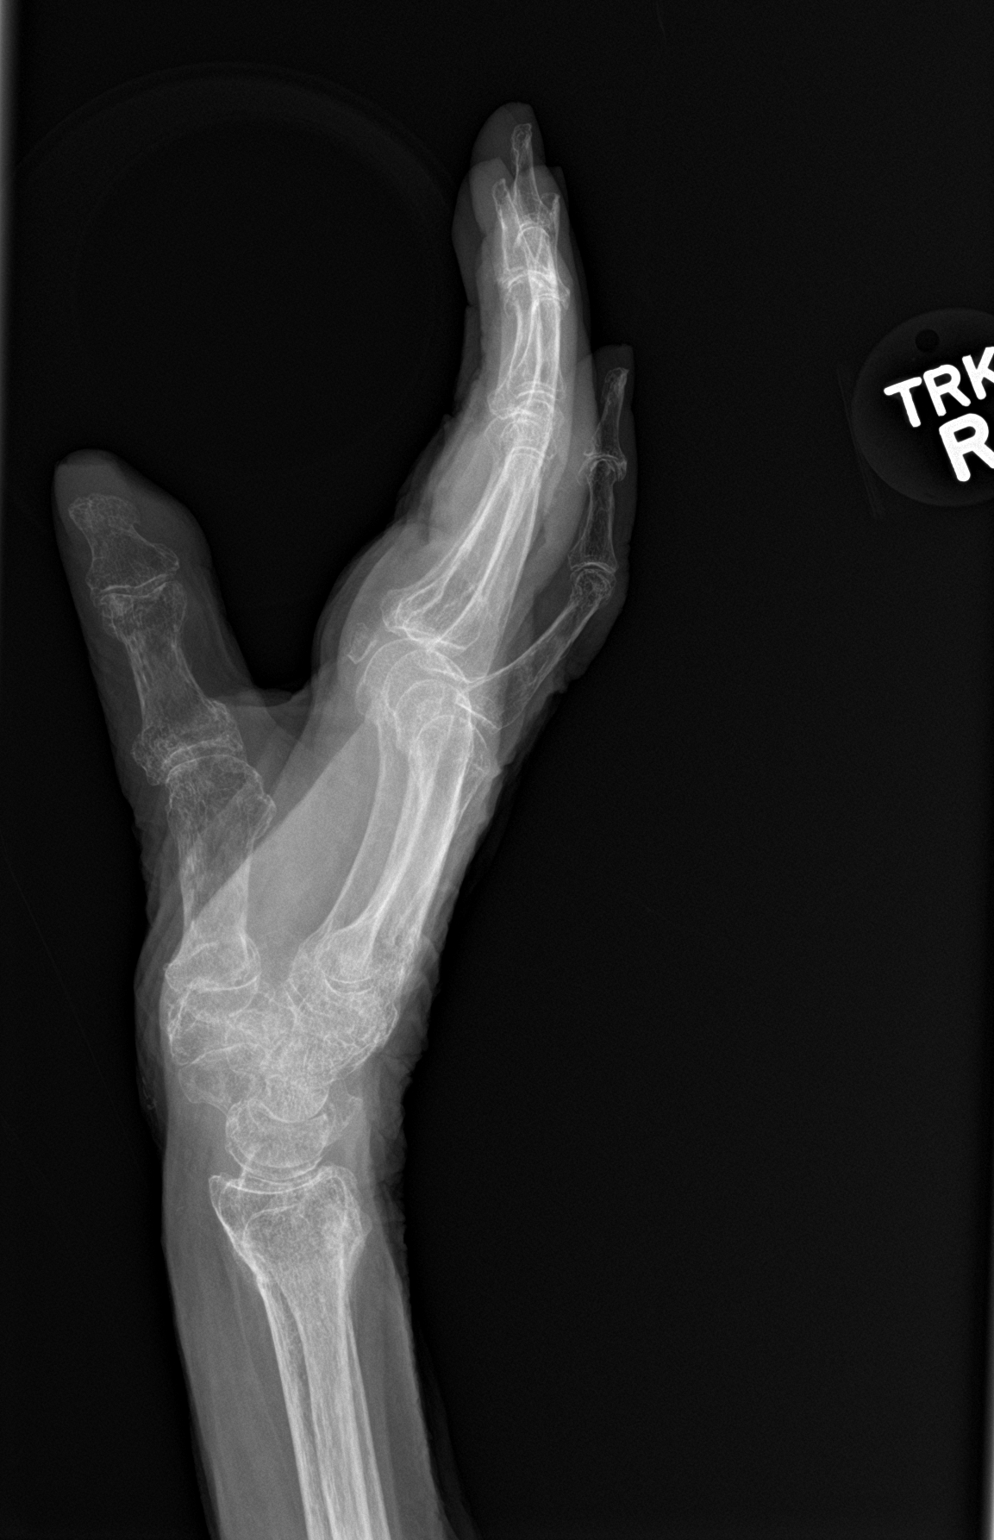

[3 of 3 positions shown; findings below may reference images not displayed]

FINDINGS: Frontal, oblique, and lateral views were obtained. There is a
transverse fracture of the distal radial metaphysis with alignment
essentially anatomic. No other acute fracture. No dislocation. Bones
are diffusely osteoporotic. There is osteoarthritic change in all
PIP and DIP joints, most marked in the first IP and second DIP
joints. There is no erosive change or bony destruction.
IMPRESSION: Transversely oriented fracture distal radial metaphysis with
alignment essentially anatomic. No other fractures. No dislocation.
Several areas of osteoarthritic change in distal joints. Bones
diffusely osteoporotic.
